# Patient Record
Sex: Female | Born: 1975
Health system: Southern US, Community
[De-identification: ages and names within clinical notes are randomized; demographics above are authoritative.]

## PROBLEM LIST (undated history)

## (undated) HISTORY — PX: TUBAL LIGATION: SHX77

## (undated) HISTORY — PX: CHOLECYSTECTOMY: SHX55

---

## 1999-10-12 ENCOUNTER — Other Ambulatory Visit: Admission: RE | Admit: 1999-10-12 | Discharge: 1999-10-12 | Payer: Self-pay | Admitting: *Deleted

## 2000-06-30 ENCOUNTER — Encounter: Payer: Self-pay | Admitting: Obstetrics and Gynecology

## 2000-06-30 ENCOUNTER — Ambulatory Visit (HOSPITAL_COMMUNITY): Admission: RE | Admit: 2000-06-30 | Discharge: 2000-06-30 | Payer: Self-pay | Admitting: Obstetrics and Gynecology

## 2000-08-22 ENCOUNTER — Inpatient Hospital Stay (HOSPITAL_COMMUNITY): Admission: AD | Admit: 2000-08-22 | Discharge: 2000-08-22 | Payer: Self-pay | Admitting: Obstetrics & Gynecology

## 2000-08-25 ENCOUNTER — Inpatient Hospital Stay (HOSPITAL_COMMUNITY): Admission: AD | Admit: 2000-08-25 | Discharge: 2000-08-27 | Payer: Self-pay | Admitting: Obstetrics and Gynecology

## 2000-09-20 ENCOUNTER — Encounter: Payer: Self-pay | Admitting: Emergency Medicine

## 2000-09-20 ENCOUNTER — Emergency Department (HOSPITAL_COMMUNITY): Admission: EM | Admit: 2000-09-20 | Discharge: 2000-09-20 | Payer: Self-pay | Admitting: Emergency Medicine

## 2000-09-25 ENCOUNTER — Encounter: Payer: Self-pay | Admitting: Gastroenterology

## 2000-09-25 ENCOUNTER — Observation Stay (HOSPITAL_COMMUNITY): Admission: RE | Admit: 2000-09-25 | Discharge: 2000-09-26 | Payer: Self-pay | Admitting: Surgery

## 2000-09-25 ENCOUNTER — Encounter: Payer: Self-pay | Admitting: Surgery

## 2000-10-05 ENCOUNTER — Other Ambulatory Visit: Admission: RE | Admit: 2000-10-05 | Discharge: 2000-10-05 | Payer: Self-pay | Admitting: Obstetrics and Gynecology

## 2002-04-29 ENCOUNTER — Inpatient Hospital Stay (HOSPITAL_COMMUNITY): Admission: AD | Admit: 2002-04-29 | Discharge: 2002-04-29 | Payer: Self-pay | Admitting: Obstetrics and Gynecology

## 2002-05-03 ENCOUNTER — Inpatient Hospital Stay (HOSPITAL_COMMUNITY): Admission: AD | Admit: 2002-05-03 | Discharge: 2002-05-05 | Payer: Self-pay | Admitting: Obstetrics & Gynecology

## 2002-05-06 ENCOUNTER — Encounter: Admission: RE | Admit: 2002-05-06 | Discharge: 2002-06-05 | Payer: Self-pay | Admitting: Obstetrics and Gynecology

## 2002-05-31 ENCOUNTER — Other Ambulatory Visit: Admission: RE | Admit: 2002-05-31 | Discharge: 2002-05-31 | Payer: Self-pay | Admitting: Obstetrics and Gynecology

## 2002-07-06 ENCOUNTER — Encounter: Admission: RE | Admit: 2002-07-06 | Discharge: 2002-08-05 | Payer: Self-pay | Admitting: Obstetrics and Gynecology

## 2003-06-06 ENCOUNTER — Other Ambulatory Visit: Admission: RE | Admit: 2003-06-06 | Discharge: 2003-06-06 | Payer: Self-pay | Admitting: Obstetrics and Gynecology

## 2003-06-27 ENCOUNTER — Observation Stay (HOSPITAL_COMMUNITY): Admission: RE | Admit: 2003-06-27 | Discharge: 2003-06-28 | Payer: Self-pay | Admitting: Obstetrics and Gynecology

## 2004-05-20 ENCOUNTER — Ambulatory Visit (HOSPITAL_COMMUNITY): Admission: RE | Admit: 2004-05-20 | Discharge: 2004-05-20 | Payer: Self-pay | Admitting: Obstetrics and Gynecology

## 2005-11-28 ENCOUNTER — Other Ambulatory Visit: Admission: RE | Admit: 2005-11-28 | Discharge: 2005-11-28 | Payer: Self-pay | Admitting: Obstetrics and Gynecology

## 2015-10-30 ENCOUNTER — Telehealth: Payer: Self-pay | Admitting: *Deleted

## 2015-10-30 NOTE — Telephone Encounter (Signed)
Unable to reach patient at time of pre-visit call. Left message for patient to return call when available.  

## 2015-11-02 ENCOUNTER — Ambulatory Visit (INDEPENDENT_AMBULATORY_CARE_PROVIDER_SITE_OTHER): Payer: BLUE CROSS/BLUE SHIELD | Admitting: Family Medicine

## 2015-11-02 ENCOUNTER — Encounter: Payer: Self-pay | Admitting: Family Medicine

## 2015-11-02 VITALS — BP 114/88 | HR 90 | Temp 97.9°F | Ht 62.0 in | Wt 205.6 lb

## 2015-11-02 DIAGNOSIS — R51 Headache: Secondary | ICD-10-CM

## 2015-11-02 DIAGNOSIS — R519 Headache, unspecified: Secondary | ICD-10-CM

## 2015-11-02 DIAGNOSIS — N62 Hypertrophy of breast: Secondary | ICD-10-CM | POA: Diagnosis not present

## 2015-11-02 DIAGNOSIS — M546 Pain in thoracic spine: Secondary | ICD-10-CM | POA: Diagnosis not present

## 2015-11-02 MED ORDER — CYCLOBENZAPRINE HCL 10 MG PO TABS: 10.0000 mg | ORAL_TABLET | Freq: Every day | ORAL | Status: DC

## 2015-11-02 MED FILL — CYCLOBENZAPRINE 10 MG TAB: 10 | 30 days supply | Qty: 30 | Fill #0

## 2015-11-02 NOTE — Patient Instructions (Signed)
It was very nice to meet you today!   Please come and see me at your convenience in the next few months for a physical and labs Try the flexeril at bedtime for a week or so- a 1/2 tablet may be enough for you. This medication will make you sleepy so do not use it when you need to drive!  I am hopeful that this will help with your back pain and also your headaches Certainly your heavy breasts are likely contributing to your upper back pain- I will refer you to a plastic surgeon to discuss a possible reduction

## 2015-11-02 NOTE — Progress Notes (Signed)
[place] at [facility] [address] [place], [place] [address] [phone]  Date:  11/02/2015   Name:  Kaitlyn Roach   DOB:  [birth year]   MRN:  [id number]  PCP:  Lamar Blinks, MD    Chief Complaint: Establish Care   History of Present Illness:  Kaitlyn Roach is a 40 y.o. very pleasant female patient who presents with the following:  Here today as a new patient to establish care with me today  She has not had a CPE in several years  She is married, 2 children ages 79 and 60.  She is a bookkeeper.   She did have her gallbladder removed in 2002.  No other major health history.   She has never been a smoker, rarely drinks alcohol She does not exercise much.    She did have some labs a few years ago and a pap -  She reports that her pap was abnormal but the repeat pap was ok.   She does have HA 5-6x a week.  This has been the case for 7-8 months- prior to this she had just occasional HA.   She does have upper back pain as well.   She sits a lot at work.    The HA may last several hours- she will take ibuprofen or excedrin migraine most day of the week.   The HA are often present in the am.  They generally start in the back of her head.  She wonders if they are related to her back pain When she has the HA she does not generally have nausea or phono/ photophobia but this does occur a few times of the month.  No vomiting with HA.  No aura  No aura  She does have DDD breasts and knows that these are likely contributing to her back pain. She has thought about a reduction but has not yet taken any steps in this direction  There are no active problems to display for this patient.   No past medical history on file.  Past Surgical History  Procedure Laterality Date  . Cholecystectomy    . Tubal ligation      Social History  Substance Use Topics  . Smoking status: Never Smoker   . Smokeless tobacco: Never Used   . Alcohol Use: 0.0 oz/week    0 Standard drinks or equivalent per week    Family History  Problem Relation Age of Onset  . Hyperlipidemia Mother   . Hypertension Mother   . Diabetes Father   . Hyperlipidemia Father   . Hypertension Father     Allergies  Allergen Reactions  . Codeine     Medication list has been reviewed and updated.  No current outpatient prescriptions on file prior to visit.   No current facility-administered medications on file prior to visit.    Review of Systems:  As per HPI- otherwise negative.   Physical Examination: Filed Vitals:   11/02/15 1321  BP: 114/88  Pulse: 109  Temp: 97.9 F (36.6 C)   Filed Vitals:   11/02/15 1321  Height: 5\' 2"  (1.575 m)  Weight: 205 lb 9.6 oz (93.26 kg)   Body mass index is 37.6 kg/(m^2). Ideal Body Weight: Weight in (lb) to have BMI = 25: 136.4  GEN: WDWN, NAD, Non-toxic, A & O x 3, overweight with large breasts HEENT: Atraumatic, Normocephalic. Neck supple. No masses, No LAD.  Bilateral TM wnl,  oropharynx normal.  PEERL,EOMI.   Ears and Nose: No external deformity. CV: RRR, No M/G/R. No JVD. No thrill. No extra heart sounds. PULM: CTA B, no wheezes, crackles, rhonchi. No retractions. No resp. distress. No accessory muscle use. EXTR: No c/c/e NEURO Normal gait.  PSYCH: Normally interactive. Conversant. Not depressed or anxious appearing.  Calm demeanor.  She has tenderness of the muscles of the bilateral upper/ thoracic back. 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 She is interested in looking into a reduction and I will refer her to plastics Trial of flexeril for her upper back pain and HA- cautioned that this can make her sleepy and is not for long term use She will update me regarding her progress See patient instructions for more details.     Signed Lamar Blinks, MD

## 2015-11-02 NOTE — Progress Notes (Signed)
Pre visit review using our clinic review tool, if applicable. No additional management support is needed unless otherwise documented below in the visit note. 

## 2015-12-02 ENCOUNTER — Encounter: Payer: Self-pay | Admitting: Family Medicine

## 2015-12-16 ENCOUNTER — Telehealth: Payer: Self-pay | Admitting: Behavioral Health

## 2015-12-16 NOTE — Telephone Encounter (Signed)
Unable to reach patient at time of Pre-Visit Call.  Left message for patient to return call when available.    

## 2015-12-17 ENCOUNTER — Encounter: Payer: Self-pay | Admitting: Family Medicine

## 2015-12-17 ENCOUNTER — Encounter: Payer: BLUE CROSS/BLUE SHIELD | Admitting: Family Medicine

## 2015-12-21 ENCOUNTER — Encounter: Payer: BLUE CROSS/BLUE SHIELD | Admitting: Family Medicine

## 2016-01-13 ENCOUNTER — Telehealth: Payer: Self-pay | Admitting: *Deleted

## 2016-01-13 NOTE — Telephone Encounter (Signed)
Unable to reach patient at time of pre-visit call. Left message for patient to return call when available.  

## 2016-01-14 ENCOUNTER — Encounter: Payer: BLUE CROSS/BLUE SHIELD | Admitting: Family Medicine

## 2016-01-15 ENCOUNTER — Telehealth: Payer: Self-pay | Admitting: Family Medicine

## 2016-01-15 NOTE — Telephone Encounter (Signed)
Pt was no show 01/14/16 for mychart physical. Pt has rescheduled for 01/18/16. Charge or no charge?

## 2016-01-15 NOTE — Telephone Encounter (Signed)
Charge please 

## 2016-01-18 ENCOUNTER — Encounter: Payer: Self-pay | Admitting: Family Medicine

## 2016-01-18 ENCOUNTER — Ambulatory Visit (INDEPENDENT_AMBULATORY_CARE_PROVIDER_SITE_OTHER): Payer: BLUE CROSS/BLUE SHIELD | Admitting: Family Medicine

## 2016-01-18 ENCOUNTER — Other Ambulatory Visit (HOSPITAL_COMMUNITY)
Admission: RE | Admit: 2016-01-18 | Discharge: 2016-01-18 | Disposition: A | Discharge status: Home / Self Care | Payer: BLUE CROSS/BLUE SHIELD | Source: Ambulatory Visit | Attending: Family Medicine | Admitting: Family Medicine

## 2016-01-18 VITALS — BP 119/73 | HR 100 | Temp 98.2°F | Ht 61.0 in | Wt 208.4 lb

## 2016-01-18 DIAGNOSIS — Z131 Encounter for screening for diabetes mellitus: Secondary | ICD-10-CM | POA: Diagnosis not present

## 2016-01-18 DIAGNOSIS — Z130 Encounter for screening for diseases of the blood and blood-forming organs and certain disorders involving the immune mechanism: Secondary | ICD-10-CM | POA: Diagnosis not present

## 2016-01-18 DIAGNOSIS — E669 Obesity, unspecified: Secondary | ICD-10-CM

## 2016-01-18 DIAGNOSIS — R51 Headache: Secondary | ICD-10-CM

## 2016-01-18 DIAGNOSIS — Z124 Encounter for screening for malignant neoplasm of cervix: Secondary | ICD-10-CM

## 2016-01-18 DIAGNOSIS — R519 Headache, unspecified: Secondary | ICD-10-CM

## 2016-01-18 DIAGNOSIS — Z13220 Encounter for screening for lipoid disorders: Secondary | ICD-10-CM

## 2016-01-18 DIAGNOSIS — Z01419 Encounter for gynecological examination (general) (routine) without abnormal findings: Secondary | ICD-10-CM | POA: Diagnosis present

## 2016-01-18 DIAGNOSIS — Z0000 Encounter for general adult medical examination without abnormal findings: Secondary | ICD-10-CM | POA: Diagnosis not present

## 2016-01-18 DIAGNOSIS — Z23 Encounter for immunization: Secondary | ICD-10-CM

## 2016-01-18 DIAGNOSIS — Z1151 Encounter for screening for human papillomavirus (HPV): Secondary | ICD-10-CM | POA: Diagnosis not present

## 2016-01-18 DIAGNOSIS — Z1329 Encounter for screening for other suspected endocrine disorder: Secondary | ICD-10-CM | POA: Diagnosis not present

## 2016-01-18 DIAGNOSIS — M549 Dorsalgia, unspecified: Secondary | ICD-10-CM

## 2016-01-18 DIAGNOSIS — N62 Hypertrophy of breast: Secondary | ICD-10-CM

## 2016-01-18 DIAGNOSIS — M546 Pain in thoracic spine: Secondary | ICD-10-CM

## 2016-01-18 LAB — HEMOGLOBIN A1C: Hgb A1c MFr Bld: 5.2 % (ref 4.6–6.5)

## 2016-01-18 LAB — COMPREHENSIVE METABOLIC PANEL
ALT: 26 U/L (ref 0–35)
AST: 19 U/L (ref 0–37)
Albumin: 4 g/dL (ref 3.5–5.2)
Alkaline Phosphatase: 104 U/L (ref 39–117)
BUN: 8 mg/dL (ref 6–23)
CO2: 25 mEq/L (ref 19–32)
Calcium: 9.2 mg/dL (ref 8.4–10.5)
Chloride: 103 mEq/L (ref 96–112)
Creatinine, Ser: 0.62 mg/dL (ref 0.40–1.20)
GFR: 113.3 mL/min (ref >60.00)
Glucose, Bld: 91 mg/dL (ref 70–99)
Potassium: 3.7 mEq/L (ref 3.5–5.1)
Sodium: 137 mEq/L (ref 135–145)
Total Bilirubin: 0.3 mg/dL (ref 0.2–1.2)
Total Protein: 6.9 g/dL (ref 6.0–8.3)

## 2016-01-18 LAB — LIPID PANEL
Cholesterol: 237 mg/dL — ABNORMAL HIGH (ref 0–200)
HDL: 42.3 mg/dL (ref >39.00)
LDL Cholesterol: 158 mg/dL — ABNORMAL HIGH (ref 0–99)
NonHDL: 195.18
Total CHOL/HDL Ratio: 6
Triglycerides: 188 mg/dL — ABNORMAL HIGH (ref 0.0–149.0)
VLDL: 37.6 mg/dL (ref 0.0–40.0)

## 2016-01-18 LAB — CBC
HCT: 39.2 % (ref 36.0–46.0)
Hemoglobin: 13 g/dL (ref 12.0–15.0)
MCHC: 33.1 g/dL (ref 30.0–36.0)
MCV: 81.9 fl (ref 78.0–100.0)
Platelets: 226 10*3/uL (ref 150.0–400.0)
RBC: 4.79 Mil/uL (ref 3.87–5.11)
RDW: 14.4 % (ref 11.5–15.5)
WBC: 9.6 10*3/uL (ref 4.0–10.5)

## 2016-01-18 LAB — TSH: TSH: 0.72 u[IU]/mL (ref 0.35–4.50)

## 2016-01-18 MED ORDER — CYCLOBENZAPRINE HCL 10 MG PO TABS: 10.0000 mg | ORAL_TABLET | Freq: Every day | ORAL | Status: DC

## 2016-01-18 MED FILL — CYCLOBENZAPRINE 10 MG TAB: 10 | 30 days supply | Qty: 30 | Fill #0

## 2016-01-18 NOTE — Progress Notes (Signed)
Pre visit review using our clinic review tool, if applicable. No additional management support is needed unless otherwise documented below in the visit note. 

## 2016-01-18 NOTE — Telephone Encounter (Signed)
Marked to charge and mailing no show letter °

## 2016-01-18 NOTE — Patient Instructions (Addendum)
I will be in touch with your labs asap-  You got your tetanus shot today Please contact Dr. Leafy Ro office with your breast reduction denial letter; they are used to dealing with this and can likely try to get this approved for you  It it time for a mammogram this year (if not done already). This is offered at the imaging department downstairs - [phone] I will go ahead and set you up for physical therapy for your back pain and headaches

## 2016-01-18 NOTE — Progress Notes (Signed)
[place] at [facility] [address] [place], [place] [address] [phone]  Date:  01/18/2016   Name:  Kaitlyn Roach   DOB:  1976-08-02   MRN:  [id number]  PCP:  Lamar Blinks, MD    Chief Complaint: Annual Exam   History of Present Illness:  Kaitlyn Roach is a 40 y.o. very pleasant female patient who presents with the following:  Here today seeking a CPE, labs and pap.  She is fasting today for labs. Seen by myself in March with headaches-  Partial HPI from that visit  She is married, 2 children ages 80 and 37. She is a bookkeeper.  She did have her gallbladder removed in 2002. No other major health history.  She has never been a smoker, rarely drinks alcohol She does not exercise much.  She did have some labs a few years ago and a pap - She reports that her pap was abnormal but the repeat pap was ok.  She does have HA 5-6x a week. This has been the case for 7-8 months- prior to this she had just occasional HA.  She does have upper back pain as well.  She sits a lot at work.   At that visit we tried some prn flexeril which has helped with her HA- she needs a refill of this.  She continues to notice pain in her upper back and neck, more so in the morning after sleeping She did see plastic surgery- she wants to to the breast reduction but was told this would not be covered by her insurance She has been trying to lose weight and disappointed to not see progress today  abnl pap about 3.5 years ago but normal on repeat  LMP 5/26   Wt Readings from Last 3 Encounters:  01/18/16 208 lb 6.4 oz (94.53 kg)  11/02/15 205 lb 9.6 oz (93.26 kg)     Patient Active Problem List   Diagnosis Date Noted  . Bilateral thoracic back pain 11/02/2015  . Macromastia 11/02/2015    No past medical history on file.  Past Surgical History  Procedure Laterality Date  . Cholecystectomy    . Tubal ligation       Social History  Substance Use Topics  . Smoking status: Never Smoker   . Smokeless tobacco: Never Used  . Alcohol Use: 0.0 oz/week    0 Standard drinks or equivalent per week    Family History  Problem Relation Age of Onset  . Hyperlipidemia Mother   . Hypertension Mother   . Diabetes Father   . Hyperlipidemia Father   . Hypertension Father     Allergies  Allergen Reactions  . Codeine     Medication list has been reviewed and updated.  Current Outpatient Prescriptions on File Prior to Visit  Medication Sig Dispense Refill  . cyclobenzaprine (FLEXERIL) 10 MG tablet Take 1 tablet (10 mg total) by mouth at bedtime. 30 tablet 0   No current facility-administered medications on file prior to visit.    Review of Systems:  As per HPI- otherwise negative.   Physical Examination: Filed Vitals:   01/18/16 0832  BP: 119/73  Pulse: 100  Temp: 98.2 F (36.8 C)   Filed Vitals:   01/18/16 0832  Height: 5\' 1"  (1.549 m)  Weight: 208 lb 6.4 oz (94.53 kg)   Body mass index is 39.4 kg/(m^2). Ideal Body Weight: Weight in (lb) to have BMI =  25: 132  GEN: WDWN, NAD, Non-toxic, A & O x 3, obese, looks well HEENT: Atraumatic, Normocephalic. Neck supple. No masses, No LAD.  Bilateral TM wnl, oropharynx normal.  PEERL,EOMI.   Ears and Nose: No external deformity. CV: RRR, No M/G/R. No JVD. No thrill. No extra heart sounds. PULM: CTA B, no wheezes, crackles, rhonchi. No retractions. No resp. distress. No accessory muscle use. ABD: S, NT, ND,. No rebound. No HSM. EXTR: No c/c/e NEURO Normal gait.  PSYCH: Normally interactive. Conversant. Not depressed or anxious appearing.  Calm demeanor.  Breast: normal exam, no masses/ dimpling/ discharge. Macromastia Pelvic: normal, no vaginal lesions or discharge. Uterus normal, no CMT, no adnexal tendereness or masses    Assessment and Plan: Physical exam  Screening for hyperlipidemia - Plan: Lipid panel  Screening for deficiency  anemia - Plan: CBC  Screening for thyroid disorder - Plan: TSH  Screening for diabetes mellitus - Plan: Comprehensive metabolic panel, Hemoglobin A1c  Immunization due - Plan: Tdap vaccine greater than or equal to 7yo IM  Screening for cervical cancer - Plan: Cytology - PAP  Bilateral thoracic back pain - Plan: cyclobenzaprine (FLEXERIL) 10 MG tablet  Frequent headaches - Plan: cyclobenzaprine (FLEXERIL) 10 MG tablet  Macromastia - Plan: Ambulatory referral to Physical Therapy  Upper back pain - Plan: Ambulatory referral to Physical Therapy   CPE and labs today tdap today Referral to PT for her back and neck pain- encouraged her to look further into getting her breast reduction covered; dr. Willow Ora office may be able to help her in this regard Will plan further follow- up pending labs.  I will be in touch with your labs asap-  You got your tetanus shot today Please contact Dr. Leafy Ro office with your breast reduction denial letter; they are used to dealing with this and can likely try to get this approved for you  It it time for a mammogram this year (if not done already). This is offered at the imaging department downstairs - [phone] I will go ahead and set you up for physical therapy for your back pain and headaches    Signed Lamar Blinks, MD

## 2016-01-19 LAB — CYTOLOGY - PAP

## 2016-02-04 ENCOUNTER — Encounter: Payer: Self-pay | Admitting: Family Medicine

## 2016-02-22 ENCOUNTER — Telehealth: Payer: Self-pay | Admitting: Family Medicine

## 2016-02-22 ENCOUNTER — Ambulatory Visit: Payer: BLUE CROSS/BLUE SHIELD | Admitting: Family Medicine

## 2016-02-22 DIAGNOSIS — Z0289 Encounter for other administrative examinations: Secondary | ICD-10-CM

## 2016-02-22 NOTE — Telephone Encounter (Signed)
Patient left message on VM @ 8:10 today stating she no longer needed appointment today. Charge or No Charge?

## 2016-02-22 NOTE — Telephone Encounter (Signed)
No charge but please send her a letter reminding her of policy

## 2016-02-24 ENCOUNTER — Encounter: Payer: Self-pay | Admitting: Family Medicine

## 2016-06-02 ENCOUNTER — Ambulatory Visit (INDEPENDENT_AMBULATORY_CARE_PROVIDER_SITE_OTHER): Payer: BLUE CROSS/BLUE SHIELD | Admitting: Emergency Medicine

## 2016-06-02 DIAGNOSIS — Z23 Encounter for immunization: Secondary | ICD-10-CM

## 2016-06-08 ENCOUNTER — Other Ambulatory Visit: Payer: Self-pay | Admitting: Family Medicine

## 2016-06-08 DIAGNOSIS — Z1231 Encounter for screening mammogram for malignant neoplasm of breast: Secondary | ICD-10-CM

## 2016-06-09 ENCOUNTER — Ambulatory Visit (HOSPITAL_BASED_OUTPATIENT_CLINIC_OR_DEPARTMENT_OTHER)
Admission: RE | Admit: 2016-06-09 | Discharge: 2016-06-09 | Disposition: A | Discharge status: Home / Self Care | Payer: BLUE CROSS/BLUE SHIELD | Source: Ambulatory Visit | Attending: Family Medicine | Admitting: Family Medicine

## 2016-06-09 DIAGNOSIS — Z1231 Encounter for screening mammogram for malignant neoplasm of breast: Secondary | ICD-10-CM | POA: Diagnosis not present

## 2016-06-15 ENCOUNTER — Other Ambulatory Visit: Payer: Self-pay | Admitting: Family Medicine

## 2016-06-15 DIAGNOSIS — R928 Other abnormal and inconclusive findings on diagnostic imaging of breast: Secondary | ICD-10-CM

## 2016-06-21 ENCOUNTER — Ambulatory Visit
Admission: RE | Admit: 2016-06-21 | Discharge: 2016-06-21 | Disposition: A | Discharge status: Home / Self Care | Payer: BLUE CROSS/BLUE SHIELD | Source: Ambulatory Visit | Attending: Family Medicine | Admitting: Family Medicine

## 2016-06-21 DIAGNOSIS — R928 Other abnormal and inconclusive findings on diagnostic imaging of breast: Secondary | ICD-10-CM

## 2017-05-09 ENCOUNTER — Other Ambulatory Visit: Payer: Self-pay | Admitting: Family Medicine

## 2017-05-09 DIAGNOSIS — Z1231 Encounter for screening mammogram for malignant neoplasm of breast: Secondary | ICD-10-CM

## 2017-05-14 NOTE — Progress Notes (Addendum)
[place] at [facility] [address], [place], [place] [address] [phone]  Date:  05/17/2017   Name:  Kaitlyn Roach   DOB:  [birth year]   MRN:  [id number]  PCP:  Darreld Mclean, MD    Chief Complaint: Annual Exam   History of Present Illness:  Kaitlyn Roach is a 41 y.o. very pleasant female patient who presents with the following:  Here today for a CPE Last visit with me in June of last year She is married, 2 children ages 33 and 68. She is a bookkeeper.  She did have her gallbladder removed in 2002. No other major health history.  She has never been a smoker, rarely drinks alcohol She does not exercise much.  S/p BTL, cholecystectomy Flu shot due: done today Mammo: done 11/17 Pap: 6/17, negative for HPV Due for labs today if she wants to have done.  She is not fasting today  She notes that her menses have been quite irregular recently she has been bleeding off an on for about 6 weeks. She will have some cramping and pain, might feel like menstrual cramps She has had a tubal and an ablation- this was done due to menorrhagia, she thinks this was in 2003 or 2004 Her GYN doctor was Dr. Quincy Simmonds- however she has not seen here in some time  She has not had irregular menses like this in the past to this extent   The children are doing ok in school this year- 60 and 57 years old.  Her daughter is home schooled and her son is at the local HS   She notes that she has felt more anxious and depressed for the last 4-5 months- her husband has noticed this and wanted her to mention it She notes that she feels really tired- "I can't get enough sleep."  She is sometimes having crying spells She notes both depression and anxiety She has not really dealt with this in the past, never treated for same No SI She would like to try some medication for depresion.  Her dad is using an SSRI but she is not sure which one  She has  noted a spot on her right eyelid for 6 months or so which seems to be getting larger    Patient Active Problem List   Diagnosis Date Noted  . Obesity, unspecified 01/18/2016  . Bilateral thoracic back pain 11/02/2015  . Macromastia 11/02/2015    No past medical history on file.  Past Surgical History:  Procedure Laterality Date  . CHOLECYSTECTOMY    . TUBAL LIGATION      Social History  Substance Use Topics  . Smoking status: Never Smoker  . Smokeless tobacco: Never Used  . Alcohol use 0.0 oz/week    Family History  Problem Relation Age of Onset  . Hyperlipidemia Mother   . Hypertension Mother   . Diabetes Father   . Hyperlipidemia Father   . Hypertension Father     Allergies  Allergen Reactions  . Codeine     Medication list has been reviewed and updated.  Current Outpatient Prescriptions on File Prior to Visit  Medication Sig Dispense Refill  . cyclobenzaprine (FLEXERIL) 10 MG tablet Take 1 tablet (10 mg total) by mouth at bedtime. 30 tablet 1   No current facility-administered medications on file prior to visit.     Review of Systems:  As per HPI- otherwise negative.  Physical Examination: Vitals:   05/17/17 1504  BP: 123/84  Pulse: 87  Temp: 97.9 F (36.6 C)  SpO2: 99%   Vitals:   05/17/17 1504  Weight: 201 lb (91.2 kg)  Height: 5\' 1"  (1.549 m)   Body mass index is 37.98 kg/m. Ideal Body Weight: Weight in (lb) to have BMI = 25: 132  GEN: WDWN, NAD, Non-toxic, A & O x 3, obese, otherwise looks well HEENT: Atraumatic, Normocephalic. Neck supple. No masses, No LAD.  Bilateral TM wnl, oropharynx normal.  PEERL,EOMI.   Ears and Nose: No external deformity. CV: RRR, No M/G/R. No JVD. No thrill. No extra heart sounds. PULM: CTA B, no wheezes, crackles, rhonchi. No retractions. No resp. distress. No accessory muscle use. ABD: S, NT, ND, +BS. No rebound. No HSM. EXTR: No c/c/e NEURO Normal gait.  PSYCH: Normally interactive. Conversant. Not  depressed or anxious appearing.  Calm demeanor.  Breast: normal exam, no masses/ dimpling/ discharge Pelvic: normal, no vaginal lesions or discharge. Uterus normal, no CMT, no adnexal tendereness or masses   Results for orders placed or performed in visit on 05/17/17  POCT urine pregnancy  Result Value Ref Range   Preg Test, Ur Negative Negative     Assessment and Plan: Physical exam  Screening for hyperlipidemia - Plan: Lipid panel  Screening for deficiency anemia - Plan: CBC  Screening for thyroid disorder - Plan: TSH  Screening for diabetes mellitus - Plan: Comprehensive metabolic panel, Hemoglobin A1c  Screening for cervical cancer - Plan: Cytology - PAP  Irregular menses - Plan: POCT urine pregnancy, FSH  Anxiety and depression - Plan: TSH, FLUoxetine (PROZAC) 20 MG tablet  Here today for a CPE- labs pending as above She has also noted spotty menstrual bleeding for the last 4-6 weeks. S/p BTL- confirmed that she is not pregnant Will obtain pap and labs as above If all normal and bleeding continues will refer to GYN She has noted depression for the last few months- no SI, but she would like to try a medication for this.  Will start her on prozac 20 mg. Also encouraged exercise and time outdoors. She will see me in a few weeks for a recheck- she will seek help right away if not doing ok in the meantime  Signed Lamar Blinks, MD  10/4- message to pt Received her labs except pap is still pending Results for orders placed or performed in visit on 05/17/17  CBC  Result Value Ref Range   WBC 9.4 4.0 - 10.5 K/uL   RBC 4.81 3.87 - 5.11 Mil/uL   Platelets 224.0 150.0 - 400.0 K/uL   Hemoglobin 13.2 12.0 - 15.0 g/dL   HCT 40.3 36.0 - 46.0 %   MCV 83.8 78.0 - 100.0 fl   MCHC 32.8 30.0 - 36.0 g/dL   RDW 14.4 11.5 - 15.5 %  Comprehensive metabolic panel  Result Value Ref Range   Sodium 138 135 - 145 mEq/L   Potassium 4.1 3.5 - 5.1 mEq/L   Chloride 102 96 - 112 mEq/L    CO2 28 19 - 32 mEq/L   Glucose, Bld 77 70 - 99 mg/dL   BUN 7 6 - 23 mg/dL   Creatinine, Ser 0.63 0.40 - 1.20 mg/dL   Total Bilirubin 0.4 0.2 - 1.2 mg/dL   Alkaline Phosphatase 95 39 - 117 U/L   AST 17 0 - 37 U/L   ALT 22 0 - 35 U/L   Total Protein 7.2 6.0 - 8.3  g/dL   Albumin 4.1 3.5 - 5.2 g/dL   Calcium 9.4 8.4 - 10.5 mg/dL   GFR 110.49 >60.00 mL/min  Lipid panel  Result Value Ref Range   Cholesterol 230 (H) 0 - 200 mg/dL   Triglycerides 243.0 (H) 0.0 - 149.0 mg/dL   HDL 41.70 >39.00 mg/dL   VLDL 48.6 (H) 0.0 - 40.0 mg/dL   Total CHOL/HDL Ratio 6    NonHDL 187.95   TSH  Result Value Ref Range   TSH 0.92 0.35 - 4.50 uIU/mL  FSH  Result Value Ref Range   FSH 6.2 mIU/ML  Hemoglobin A1c  Result Value Ref Range   Hgb A1c MFr Bld 5.3 4.6 - 6.5 %  LDL cholesterol, direct  Result Value Ref Range   Direct LDL 148.0 mg/dL  POCT urine pregnancy  Result Value Ref Range   Preg Test, Ur Negative Negative

## 2017-05-17 ENCOUNTER — Other Ambulatory Visit (HOSPITAL_COMMUNITY)
Admission: RE | Admit: 2017-05-17 | Discharge: 2017-05-17 | Disposition: A | Discharge status: Home / Self Care | Payer: 59 | Source: Ambulatory Visit | Attending: Family Medicine | Admitting: Family Medicine

## 2017-05-17 ENCOUNTER — Encounter: Payer: Self-pay | Admitting: Family Medicine

## 2017-05-17 ENCOUNTER — Ambulatory Visit (INDEPENDENT_AMBULATORY_CARE_PROVIDER_SITE_OTHER): Payer: 59 | Admitting: Family Medicine

## 2017-05-17 VITALS — BP 123/84 | HR 87 | Temp 97.9°F | Ht 61.0 in | Wt 201.0 lb

## 2017-05-17 DIAGNOSIS — Z23 Encounter for immunization: Secondary | ICD-10-CM

## 2017-05-17 DIAGNOSIS — F329 Major depressive disorder, single episode, unspecified: Secondary | ICD-10-CM | POA: Insufficient documentation

## 2017-05-17 DIAGNOSIS — Z130 Encounter for screening for diseases of the blood and blood-forming organs and certain disorders involving the immune mechanism: Secondary | ICD-10-CM | POA: Diagnosis not present

## 2017-05-17 DIAGNOSIS — F419 Anxiety disorder, unspecified: Secondary | ICD-10-CM | POA: Diagnosis not present

## 2017-05-17 DIAGNOSIS — N926 Irregular menstruation, unspecified: Secondary | ICD-10-CM | POA: Diagnosis not present

## 2017-05-17 DIAGNOSIS — Z0000 Encounter for general adult medical examination without abnormal findings: Secondary | ICD-10-CM | POA: Diagnosis not present

## 2017-05-17 DIAGNOSIS — Z131 Encounter for screening for diabetes mellitus: Secondary | ICD-10-CM | POA: Insufficient documentation

## 2017-05-17 DIAGNOSIS — Z1329 Encounter for screening for other suspected endocrine disorder: Secondary | ICD-10-CM

## 2017-05-17 DIAGNOSIS — Z124 Encounter for screening for malignant neoplasm of cervix: Secondary | ICD-10-CM | POA: Diagnosis not present

## 2017-05-17 DIAGNOSIS — Z13220 Encounter for screening for lipoid disorders: Secondary | ICD-10-CM | POA: Insufficient documentation

## 2017-05-17 LAB — POCT URINE PREGNANCY: Preg Test, Ur: NEGATIVE

## 2017-05-17 MED ORDER — FLUOXETINE HCL 20 MG PO TABS: 20.0000 mg | ORAL_TABLET | Freq: Every day | ORAL | 5 refills | Status: DC

## 2017-05-17 MED FILL — FLUoxetine HCL 20 MG CAPS: 20 | 30 days supply | Qty: 60 | Fill #0

## 2017-05-17 NOTE — Patient Instructions (Addendum)
It was nice to see you again today! I will be in touch with your pap and other labs We are going to try prozac, 20 mg for depression and anxiety. Please start on 1 pill a day, and you can increase to 2 pills after 1-2 weeks if well tolerated.    Other things that can help with depression include spending time doing things you enjoy, exercise, and being outdoors.  Try to take good care of yourself!    Please see me in 4-6 weeks to check on how you are feeling  Health Maintenance, Female Adopting a healthy lifestyle and getting preventive care can go a long way to promote health and wellness. Talk with your health care provider about what schedule of regular examinations is right for you. This is a good chance for you to check in with your provider about disease prevention and staying healthy. In between checkups, there are plenty of things you can do on your own. Experts have done a lot of research about which lifestyle changes and preventive measures are most likely to keep you healthy. Ask your health care provider for more information. Weight and diet Eat a healthy diet  Be sure to include plenty of vegetables, fruits, low-fat dairy products, and lean protein.  Do not eat a lot of foods high in solid fats, added sugars, or salt.  Get regular exercise. This is one of the most important things you can do for your health. ? Most adults should exercise for at least 150 minutes each week. The exercise should increase your heart rate and make you sweat (moderate-intensity exercise). ? Most adults should also do strengthening exercises at least twice a week. This is in addition to the moderate-intensity exercise.  Maintain a healthy weight  Body mass index (BMI) is a measurement that can be used to identify possible weight problems. It estimates body fat based on height and weight. Your health care provider can help determine your BMI and help you achieve or maintain a healthy weight.  For  females 30 years of age and older: ? A BMI below 18.5 is considered underweight. ? A BMI of 18.5 to 24.9 is normal. ? A BMI of 25 to 29.9 is considered overweight. ? A BMI of 30 and above is considered obese.  Watch levels of cholesterol and blood lipids  You should start having your blood tested for lipids and cholesterol at 41 years of age, then have this test every 5 years.  You may need to have your cholesterol levels checked more often if: ? Your lipid or cholesterol levels are high. ? You are older than 41 years of age. ? You are at high risk for heart disease.  Cancer screening Lung Cancer  Lung cancer screening is recommended for adults 46-24 years old who are at high risk for lung cancer because of a history of smoking.  A yearly low-dose CT scan of the lungs is recommended for people who: ? Currently smoke. ? Have quit within the past 15 years. ? Have at least a 30-pack-year history of smoking. A pack year is smoking an average of one pack of cigarettes a day for 1 year.  Yearly screening should continue until it has been 15 years since you quit.  Yearly screening should stop if you develop a health problem that would prevent you from having lung cancer treatment.  Breast Cancer  Practice breast self-awareness. This means understanding how your breasts normally appear and feel.  It also  means doing regular breast self-exams. Let your health care provider know about any changes, no matter how small.  If you are in your 20s or 30s, you should have a clinical breast exam (CBE) by a health care provider every 1-3 years as part of a regular health exam.  If you are 72 or older, have a CBE every year. Also consider having a breast X-ray (mammogram) every year.  If you have a family history of breast cancer, talk to your health care provider about genetic screening.  If you are at high risk for breast cancer, talk to your health care provider about having an MRI and a  mammogram every year.  Breast cancer gene (BRCA) assessment is recommended for women who have family members with BRCA-related cancers. BRCA-related cancers include: ? Breast. ? Ovarian. ? Tubal. ? Peritoneal cancers.  Results of the assessment will determine the need for genetic counseling and BRCA1 and BRCA2 testing.  Cervical Cancer Your health care provider may recommend that you be screened regularly for cancer of the pelvic organs (ovaries, uterus, and vagina). This screening involves a pelvic examination, including checking for microscopic changes to the surface of your cervix (Pap test). You may be encouraged to have this screening done every 3 years, beginning at age 73.  For women ages 49-65, health care providers may recommend pelvic exams and Pap testing every 3 years, or they may recommend the Pap and pelvic exam, combined with testing for human papilloma virus (HPV), every 5 years. Some types of HPV increase your risk of cervical cancer. Testing for HPV may also be done on women of any age with unclear Pap test results.  Other health care providers may not recommend any screening for nonpregnant women who are considered low risk for pelvic cancer and who do not have symptoms. Ask your health care provider if a screening pelvic exam is right for you.  If you have had past treatment for cervical cancer or a condition that could lead to cancer, you need Pap tests and screening for cancer for at least 20 years after your treatment. If Pap tests have been discontinued, your risk factors (such as having a new sexual partner) need to be reassessed to determine if screening should resume. Some women have medical problems that increase the chance of getting cervical cancer. In these cases, your health care provider may recommend more frequent screening and Pap tests.  Colorectal Cancer  This type of cancer can be detected and often prevented.  Routine colorectal cancer screening usually  begins at 41 years of age and continues through 41 years of age.  Your health care provider may recommend screening at an earlier age if you have risk factors for colon cancer.  Your health care provider may also recommend using home test kits to check for hidden blood in the stool.  A small camera at the end of a tube can be used to examine your colon directly (sigmoidoscopy or colonoscopy). This is done to check for the earliest forms of colorectal cancer.  Routine screening usually begins at age 57.  Direct examination of the colon should be repeated every 5-10 years through 41 years of age. However, you may need to be screened more often if early forms of precancerous polyps or small growths are found.  Skin Cancer  Check your skin from head to toe regularly.  Tell your health care provider about any new moles or changes in moles, especially if there is a change in  a mole's shape or color.  Also tell your health care provider if you have a mole that is larger than the size of a pencil eraser.  Always use sunscreen. Apply sunscreen liberally and repeatedly throughout the day.  Protect yourself by wearing long sleeves, pants, a wide-brimmed hat, and sunglasses whenever you are outside.  Heart disease, diabetes, and high blood pressure  High blood pressure causes heart disease and increases the risk of stroke. High blood pressure is more likely to develop in: ? People who have blood pressure in the high end of the normal range (130-139/85-89 mm Hg). ? People who are overweight or obese. ? People who are African American.  If you are 35-41 years of age, have your blood pressure checked every 3-5 years. If you are 70 years of age or older, have your blood pressure checked every year. You should have your blood pressure measured twice-once when you are at a hospital or clinic, and once when you are not at a hospital or clinic. Record the average of the two measurements. To check your  blood pressure when you are not at a hospital or clinic, you can use: ? An automated blood pressure machine at a pharmacy. ? A home blood pressure monitor.  If you are between 51 years and 57 years old, ask your health care provider if you should take aspirin to prevent strokes.  Have regular diabetes screenings. This involves taking a blood sample to check your fasting blood sugar level. ? If you are at a normal weight and have a low risk for diabetes, have this test once every three years after 41 years of age. ? If you are overweight and have a high risk for diabetes, consider being tested at a younger age or more often. Preventing infection Hepatitis B  If you have a higher risk for hepatitis B, you should be screened for this virus. You are considered at high risk for hepatitis B if: ? You were born in a country where hepatitis B is common. Ask your health care provider which countries are considered high risk. ? Your parents were born in a high-risk country, and you have not been immunized against hepatitis B (hepatitis B vaccine). ? You have HIV or AIDS. ? You use needles to inject street drugs. ? You live with someone who has hepatitis B. ? You have had sex with someone who has hepatitis B. ? You get hemodialysis treatment. ? You take certain medicines for conditions, including cancer, organ transplantation, and autoimmune conditions.  Hepatitis C  Blood testing is recommended for: ? Everyone born from [birth year] through 1965. ? Anyone with known risk factors for hepatitis C.  Sexually transmitted infections (STIs)  You should be screened for sexually transmitted infections (STIs) including gonorrhea and chlamydia if: ? You are sexually active and are younger than 41 years of age. ? You are older than 41 years of age and your health care provider tells you that you are at risk for this type of infection. ? Your sexual activity has changed since you were last screened and you are at  an increased risk for chlamydia or gonorrhea. Ask your health care provider if you are at risk.  If you do not have HIV, but are at risk, it may be recommended that you take a prescription medicine daily to prevent HIV infection. This is called pre-exposure prophylaxis (PrEP). You are considered at risk if: ? You are sexually active and do not regularly use condoms  or know the HIV status of your partner(s). ? You take drugs by injection. ? You are sexually active with a partner who has HIV.  Talk with your health care provider about whether you are at high risk of being infected with HIV. If you choose to begin PrEP, you should first be tested for HIV. You should then be tested every 3 months for as long as you are taking PrEP. Pregnancy  If you are premenopausal and you may become pregnant, ask your health care provider about preconception counseling.  If you may become pregnant, take 400 to 800 micrograms (mcg) of folic acid every day.  If you want to prevent pregnancy, talk to your health care provider about birth control (contraception). Osteoporosis and menopause  Osteoporosis is a disease in which the bones lose minerals and strength with aging. This can result in serious bone fractures. Your risk for osteoporosis can be identified using a bone density scan.  If you are 52 years of age or older, or if you are at risk for osteoporosis and fractures, ask your health care provider if you should be screened.  Ask your health care provider whether you should take a calcium or vitamin D supplement to lower your risk for osteoporosis.  Menopause may have certain physical symptoms and risks.  Hormone replacement therapy may reduce some of these symptoms and risks. Talk to your health care provider about whether hormone replacement therapy is right for you. Follow these instructions at home:  Schedule regular health, dental, and eye exams.  Stay current with your immunizations.  Do not  use any tobacco products including cigarettes, chewing tobacco, or electronic cigarettes.  If you are pregnant, do not drink alcohol.  If you are breastfeeding, limit how much and how often you drink alcohol.  Limit alcohol intake to no more than 1 drink per day for nonpregnant women. One drink equals 12 ounces of beer, 5 ounces of wine, or 1 ounces of hard liquor.  Do not use street drugs.  Do not share needles.  Ask your health care provider for help if you need support or information about quitting drugs.  Tell your health care provider if you often feel depressed.  Tell your health care provider if you have ever been abused or do not feel safe at home. This information is not intended to replace advice given to you by your health care provider. Make sure you discuss any questions you have with your health care provider. Document Released: 02/14/2011 Document Revised: 01/07/2016 Document Reviewed: 05/05/2015 Elsevier Interactive Patient Education  [employer].

## 2017-05-18 ENCOUNTER — Encounter: Payer: Self-pay | Admitting: Family Medicine

## 2017-05-18 LAB — COMPREHENSIVE METABOLIC PANEL
ALT: 22 U/L (ref 0–35)
AST: 17 U/L (ref 0–37)
Albumin: 4.1 g/dL (ref 3.5–5.2)
Alkaline Phosphatase: 95 U/L (ref 39–117)
BILIRUBIN TOTAL: 0.4 mg/dL (ref 0.2–1.2)
BUN: 7 mg/dL (ref 6–23)
CHLORIDE: 102 meq/L (ref 96–112)
CO2: 28 meq/L (ref 19–32)
Calcium: 9.4 mg/dL (ref 8.4–10.5)
Creatinine, Ser: 0.63 mg/dL (ref 0.40–1.20)
GFR: 110.49 mL/min (ref >60.00)
GLUCOSE: 77 mg/dL (ref 70–99)
POTASSIUM: 4.1 meq/L (ref 3.5–5.1)
Sodium: 138 mEq/L (ref 135–145)
Total Protein: 7.2 g/dL (ref 6.0–8.3)

## 2017-05-18 LAB — CBC
HEMATOCRIT: 40.3 % (ref 36.0–46.0)
HEMOGLOBIN: 13.2 g/dL (ref 12.0–15.0)
MCHC: 32.8 g/dL (ref 30.0–36.0)
MCV: 83.8 fl (ref 78.0–100.0)
PLATELETS: 224 10*3/uL (ref 150.0–400.0)
RBC: 4.81 Mil/uL (ref 3.87–5.11)
RDW: 14.4 % (ref 11.5–15.5)
WBC: 9.4 10*3/uL (ref 4.0–10.5)

## 2017-05-18 LAB — LDL CHOLESTEROL, DIRECT: LDL DIRECT: 148 mg/dL

## 2017-05-18 LAB — LIPID PANEL
CHOL/HDL RATIO: 6
Cholesterol: 230 mg/dL — ABNORMAL HIGH (ref 0–200)
HDL: 41.7 mg/dL (ref >39.00)
NONHDL: 187.95
TRIGLYCERIDES: 243 mg/dL — AB (ref 0.0–149.0)
VLDL: 48.6 mg/dL — AB (ref 0.0–40.0)

## 2017-05-18 LAB — HEMOGLOBIN A1C: Hgb A1c MFr Bld: 5.3 % (ref 4.6–6.5)

## 2017-05-18 LAB — TSH: TSH: 0.92 u[IU]/mL (ref 0.35–4.50)

## 2017-05-18 LAB — FOLLICLE STIMULATING HORMONE: FSH: 6.2 m[IU]/mL

## 2017-05-19 LAB — CYTOLOGY - PAP: DIAGNOSIS: NEGATIVE

## 2017-06-20 DIAGNOSIS — D1801 Hemangioma of skin and subcutaneous tissue: Secondary | ICD-10-CM | POA: Diagnosis not present

## 2017-06-20 DIAGNOSIS — D225 Melanocytic nevi of trunk: Secondary | ICD-10-CM | POA: Diagnosis not present

## 2017-06-20 DIAGNOSIS — L814 Other melanin hyperpigmentation: Secondary | ICD-10-CM | POA: Diagnosis not present

## 2017-09-01 IMAGING — MG 2D DIGITAL DIAGNOSTIC UNILATERAL RIGHT MAMMOGRAM WITH CAD AND AD
5 series · 6 of 13 positions shown · non-contrast
Comparison: Screening mammogram dated 06/09/2016.

CLINICAL DATA: Possible mass in the central, slightly lateral right
breast and possible asymmetry in the medial, posterior right breast
on a recent baseline screening mammogram.

EXAM:
2D DIGITAL DIAGNOSTIC RIGHT MAMMOGRAM WITH CAD AND ADJUNCT TOMO
ULTRASOUND RIGHT BREAST

[R CC synth-2D]
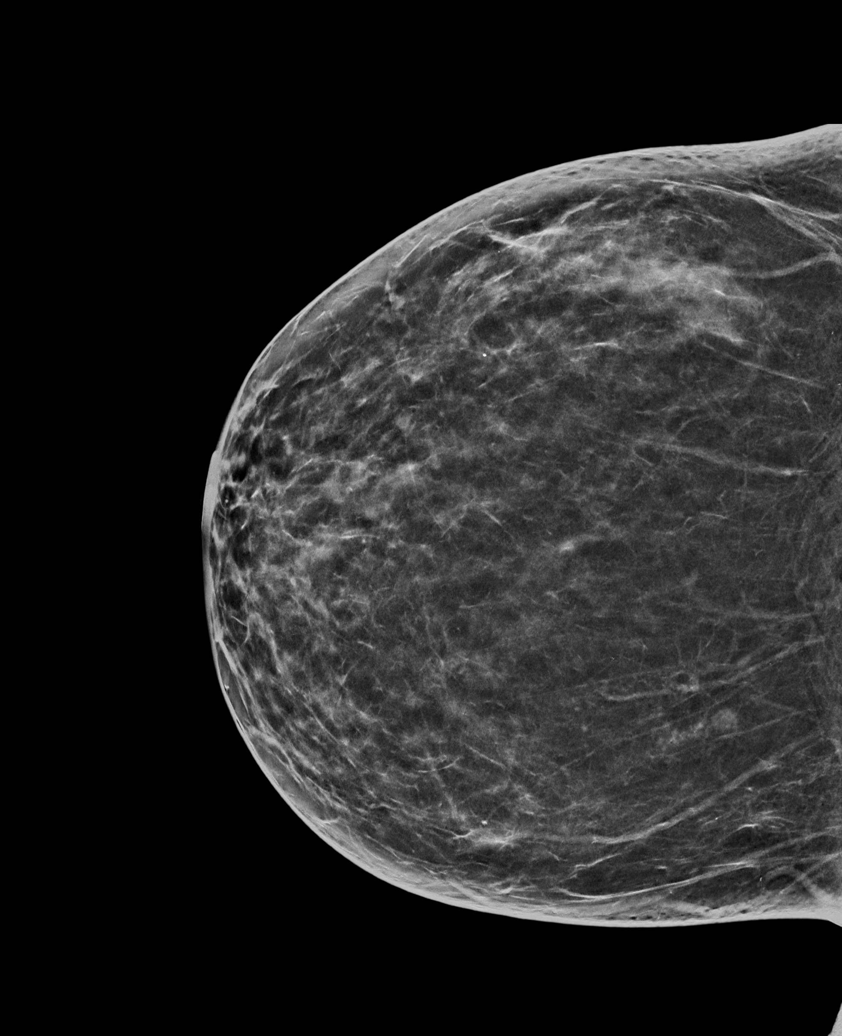

[R MLO synth-2D]
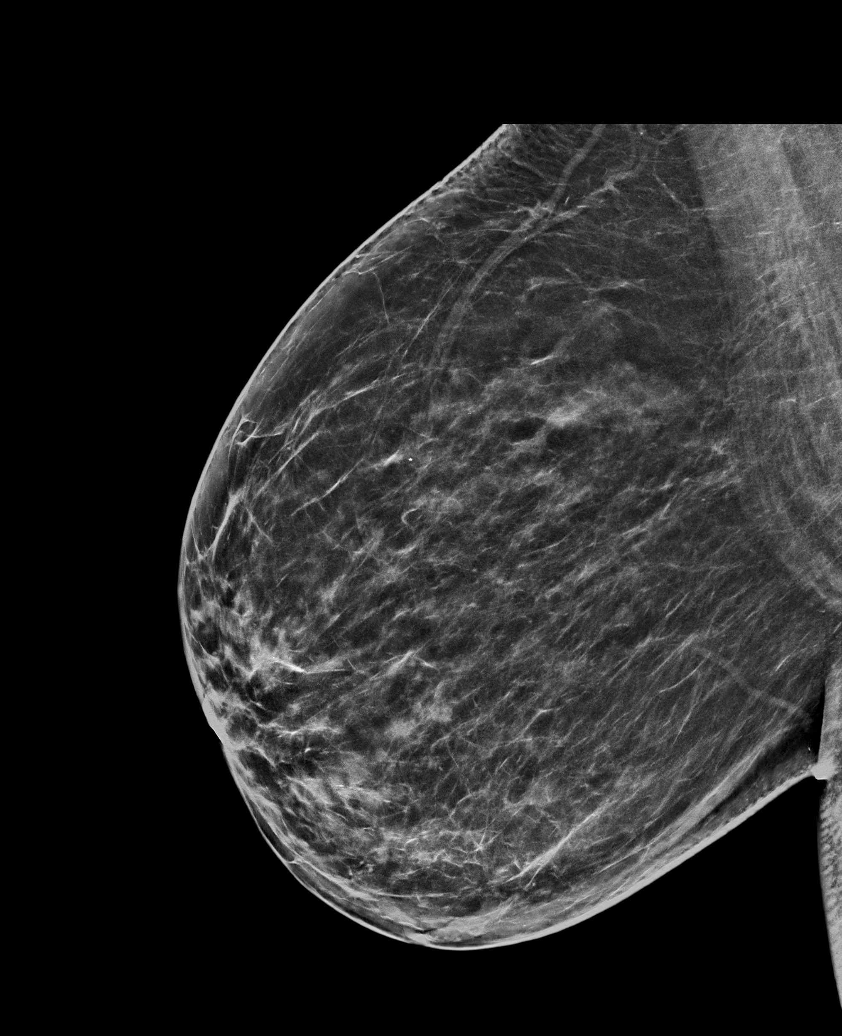

[R MLO]
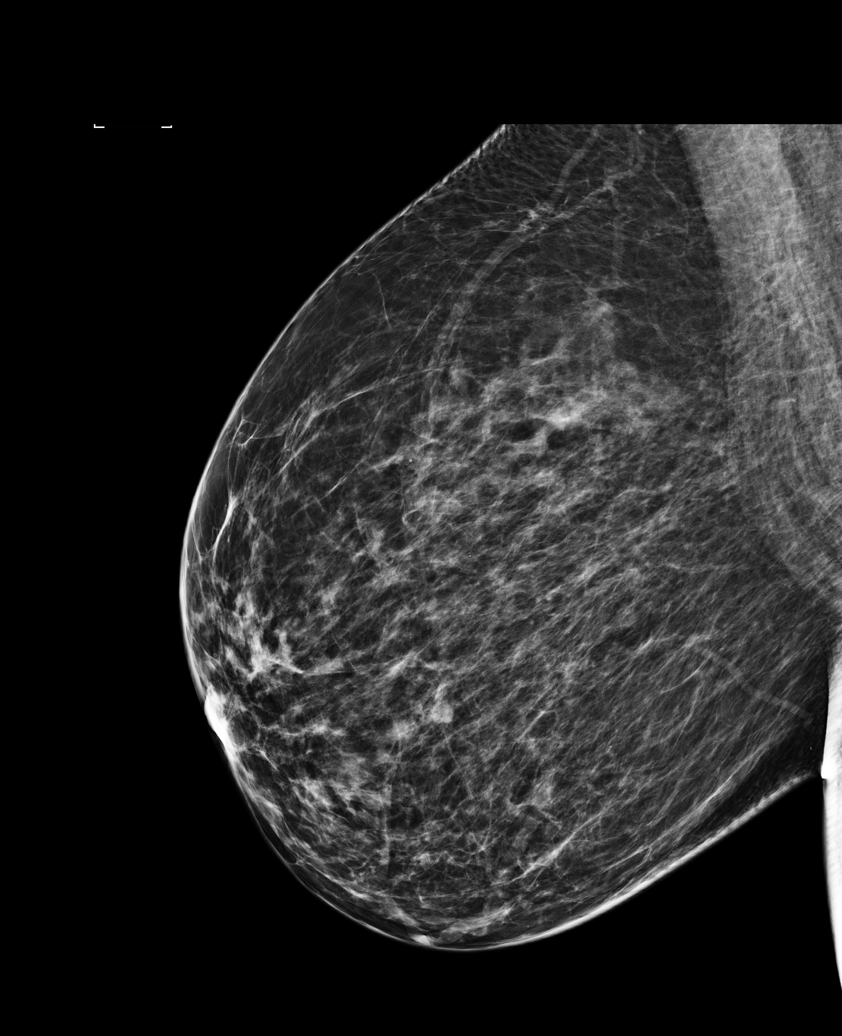

[R MLO tomo · 2 of 80 frames shown]
[frame 26/80]
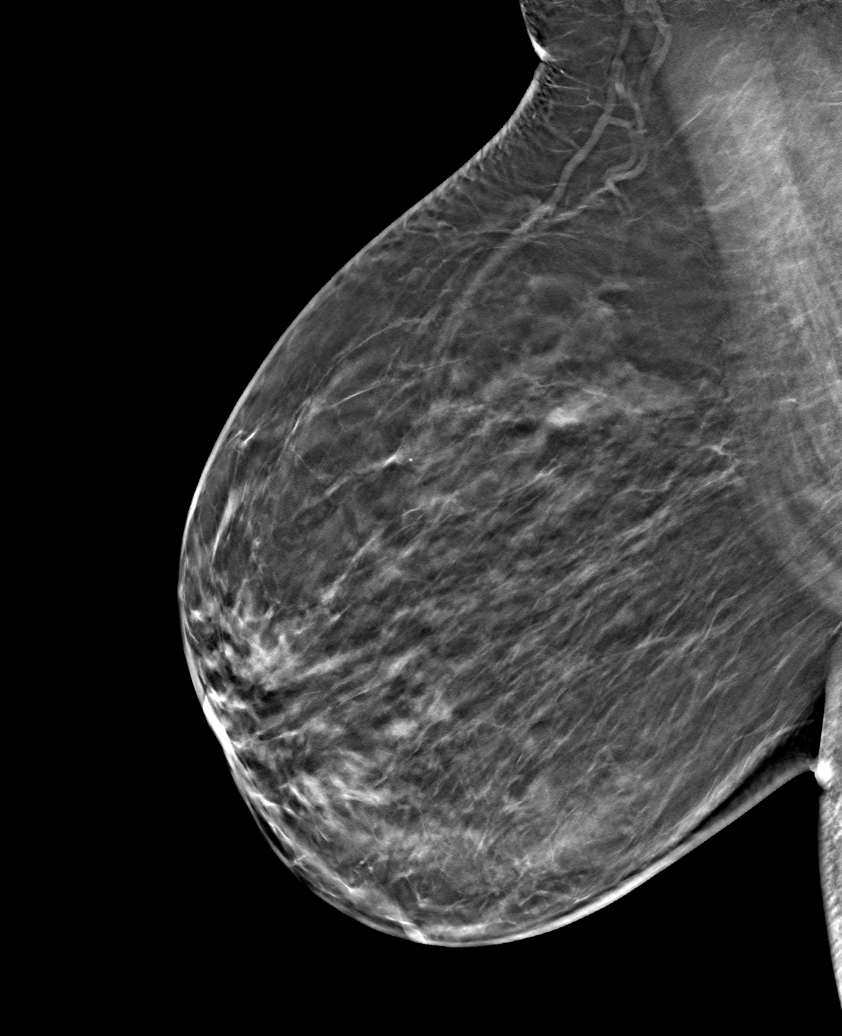
[frame 41/80]
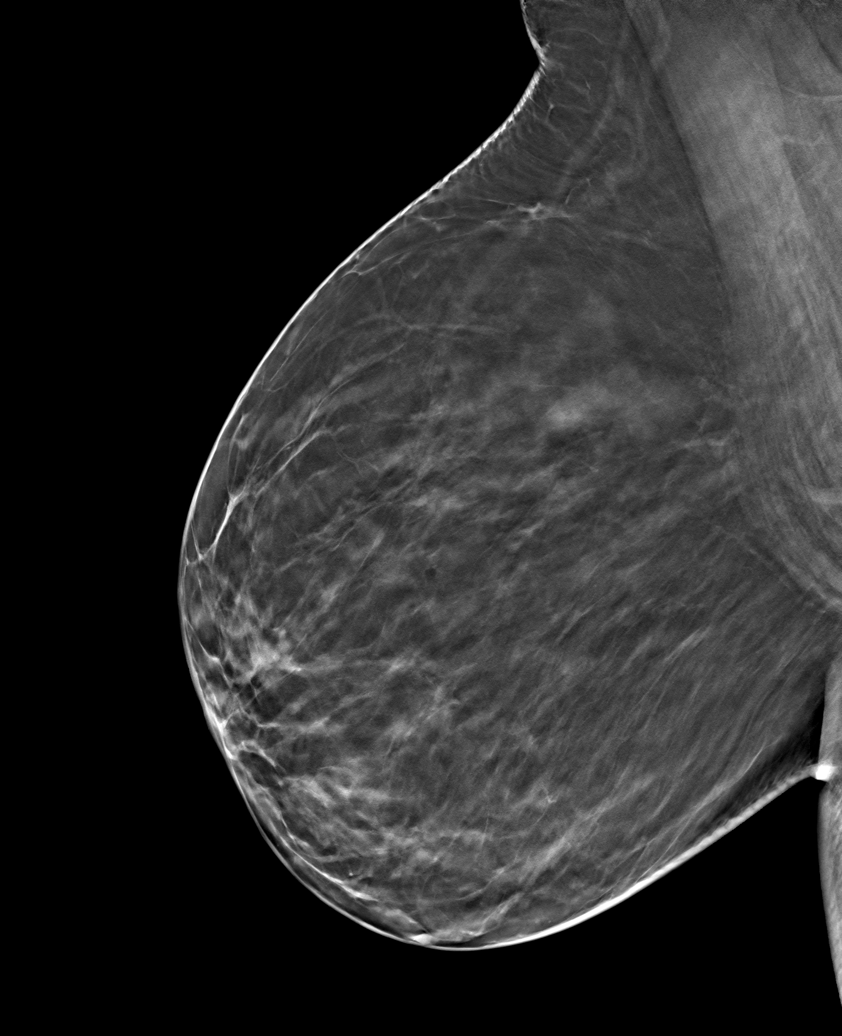

[R CC tomo · tomo slice 37/73.0]
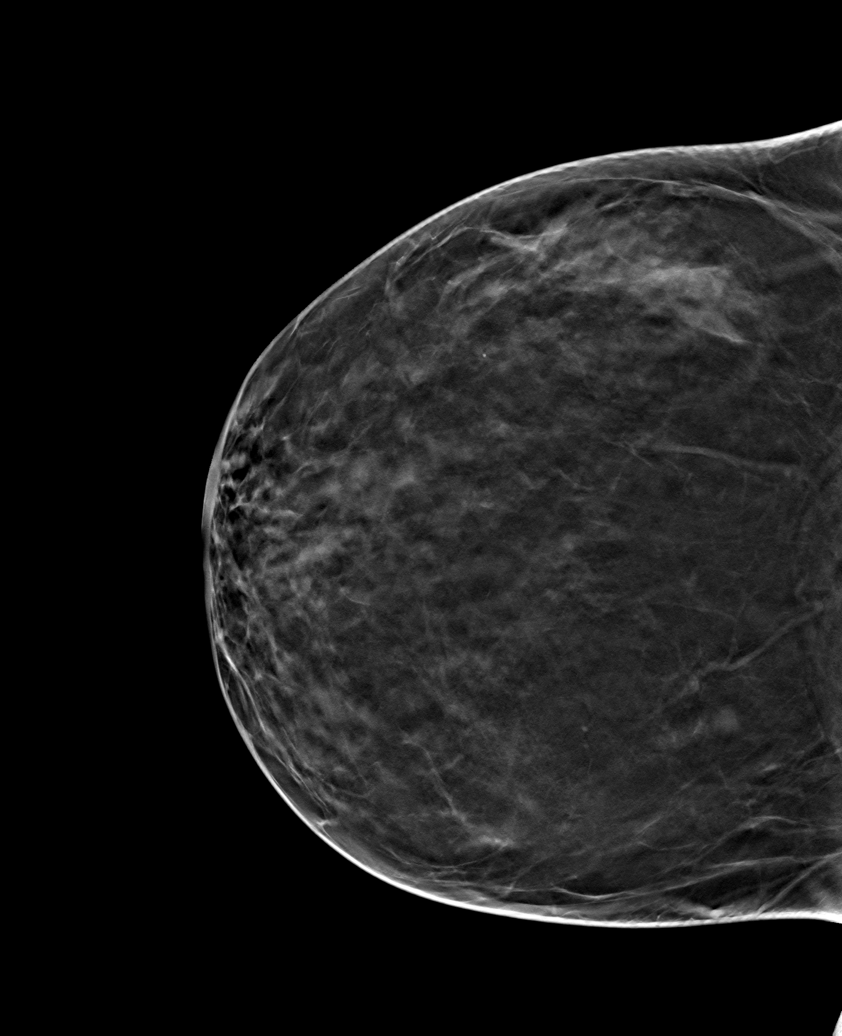

[6 of 13 positions shown; findings below may reference images not displayed]

ACR Breast Density Category b: There are scattered areas of
fibroglandular density.
FINDINGS: 2D 3D tomographic images of the right breast were obtained. These
confirm a small, oval, circumscribed mass in the lower outer
quadrant, anteriorly. There is also a small area of asymmetrical
density in the upper inner quadrant posteriorly. The small
asymmetrical density has interspersed fat.

Mammographic images were processed with CAD.

On physical exam, no mass is palpable in the lower outer right
breast or upper inner right breast.

Targeted ultrasound is performed, showing a 5 mm cyst containing
low-level internal echoes in the 8 o'clock position of the right
breast, 3 cm from the nipple. This corresponds to the mammographic
mass.

There is also an island of fibroglandular tissue in the 1:30 o'clock
position of the right breast, 15 cm from the nipple, corresponding
to the small asymmetrical density with interspersed fat.
IMPRESSION: Small, mildly complicated right breast cyst and island of normal
fibroglandular tissue, as described above. No evidence of
malignancy.

RECOMMENDATION:
Bilateral screening mammogram in 1 year.

I have discussed the findings and recommendations with the patient.
Results were also provided in writing at the conclusion of the
visit. If applicable, a reminder letter will be sent to the patient
regarding the next appointment.

BI-RADS CATEGORY  2: Benign.

## 2017-09-01 IMAGING — US ULTRASOUND RIGHT BREAST LIMITED
1 series · 7 of 7 positions shown · non-contrast
Comparison: Screening mammogram dated 06/09/2016.

CLINICAL DATA: Possible mass in the central, slightly lateral right
breast and possible asymmetry in the medial, posterior right breast
on a recent baseline screening mammogram.

EXAM:
2D DIGITAL DIAGNOSTIC RIGHT MAMMOGRAM WITH CAD AND ADJUNCT TOMO
ULTRASOUND RIGHT BREAST

[Series 1: ultrasound right breast limited · 0.07mm/px · 7 of 7 slices shown]
[im 1/7]
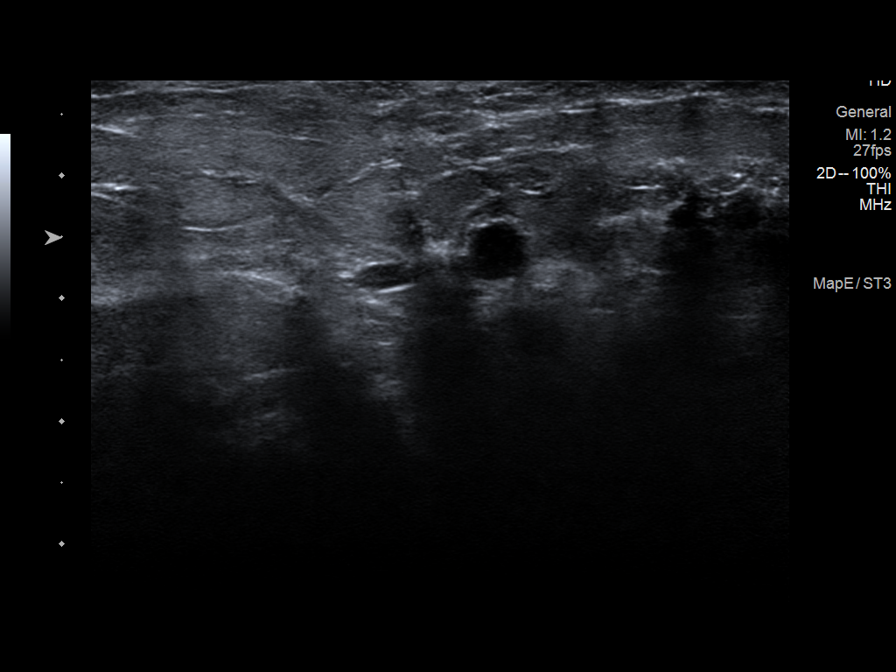
[im 2/7]
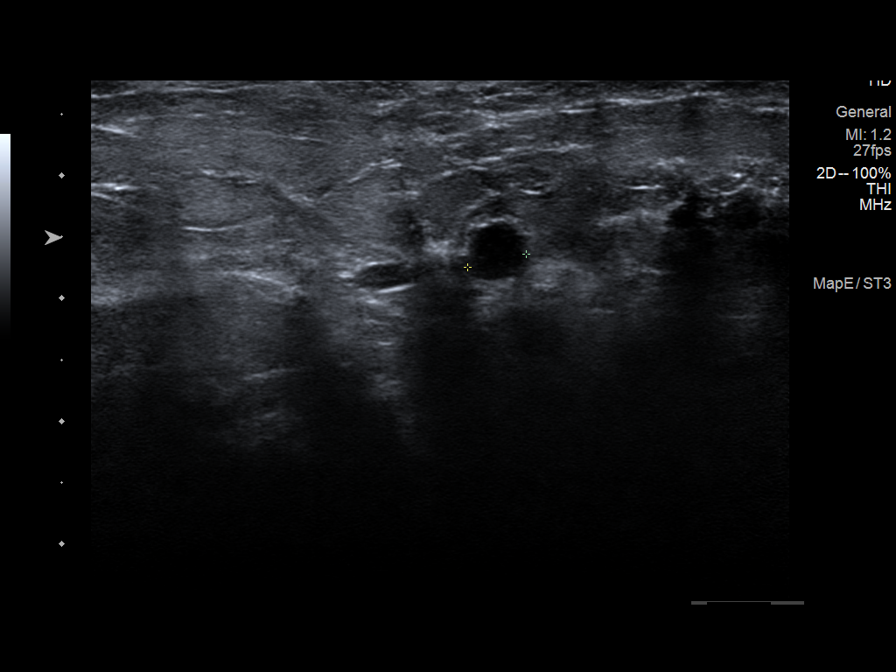
[im 3/7]
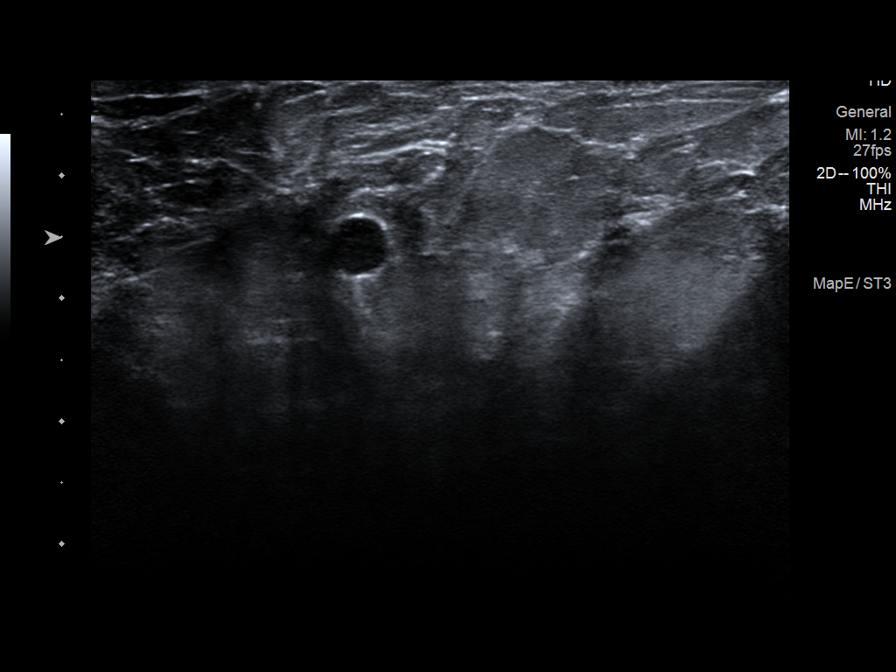
[im 4/7]
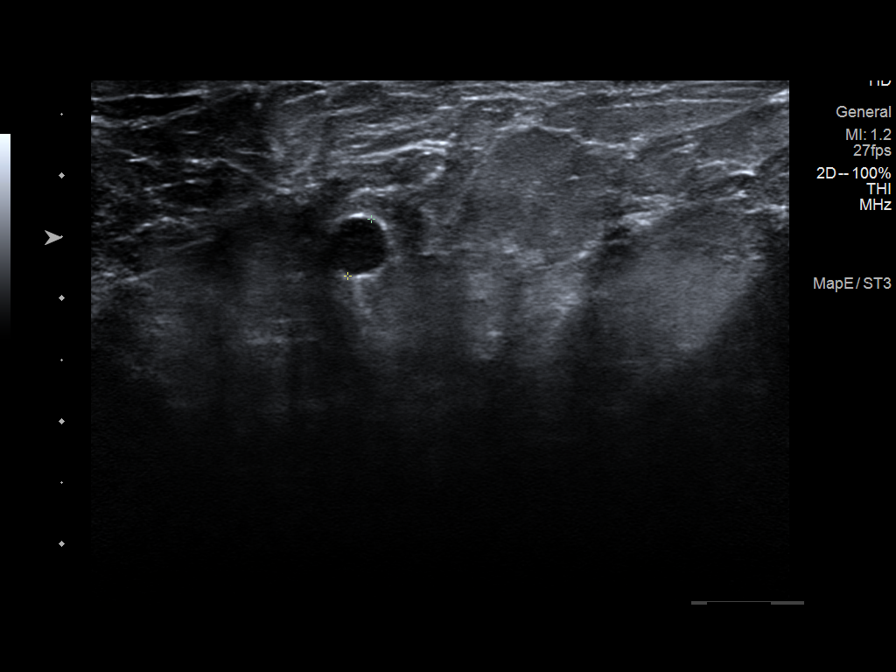
[im 5/7]
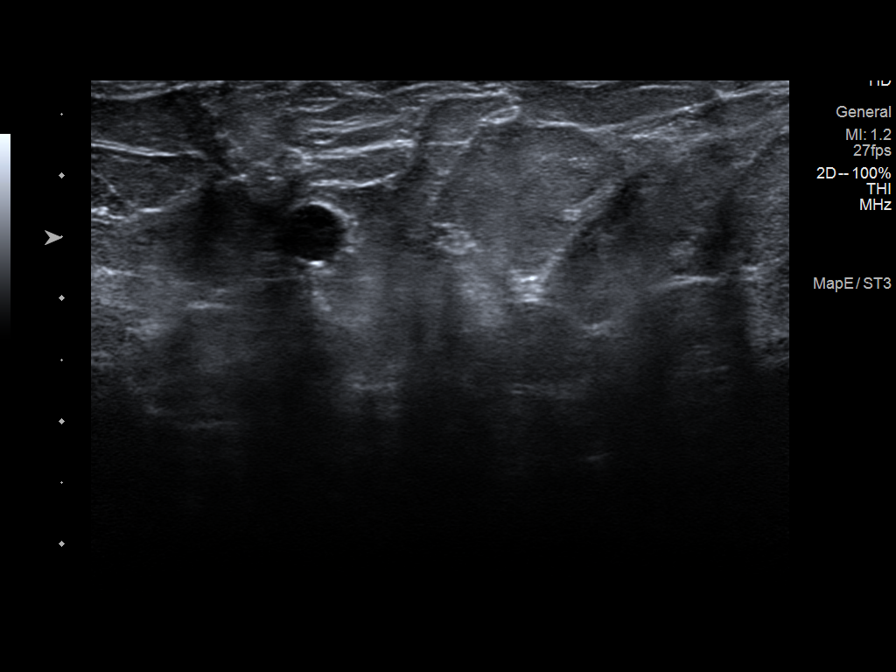
[im 6/7]
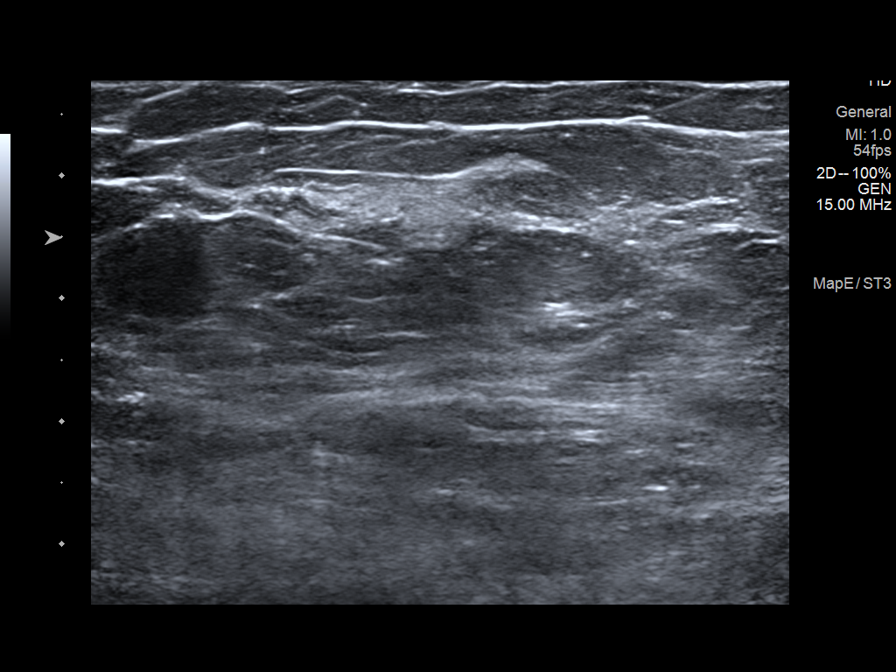
[im 7/7]
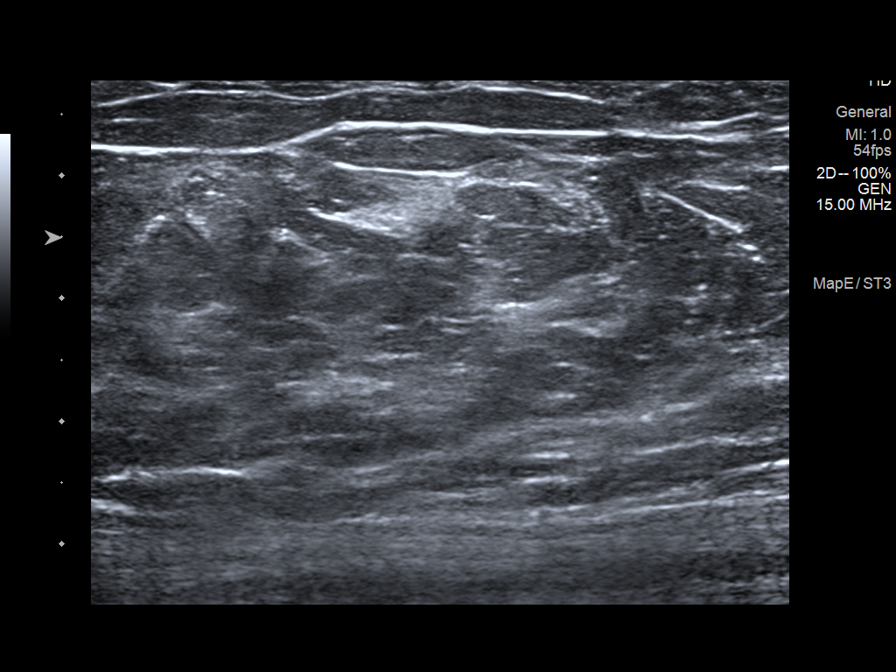

[7 of 7 positions shown; findings below may reference images not displayed]

ACR Breast Density Category b: There are scattered areas of
fibroglandular density.
FINDINGS: 2D 3D tomographic images of the right breast were obtained. These
confirm a small, oval, circumscribed mass in the lower outer
quadrant, anteriorly. There is also a small area of asymmetrical
density in the upper inner quadrant posteriorly. The small
asymmetrical density has interspersed fat.

Mammographic images were processed with CAD.

On physical exam, no mass is palpable in the lower outer right
breast or upper inner right breast.

Targeted ultrasound is performed, showing a 5 mm cyst containing
low-level internal echoes in the 8 o'clock position of the right
breast, 3 cm from the nipple. This corresponds to the mammographic
mass.

There is also an island of fibroglandular tissue in the 1:30 o'clock
position of the right breast, 15 cm from the nipple, corresponding
to the small asymmetrical density with interspersed fat.
IMPRESSION: Small, mildly complicated right breast cyst and island of normal
fibroglandular tissue, as described above. No evidence of
malignancy.

RECOMMENDATION:
Bilateral screening mammogram in 1 year.

I have discussed the findings and recommendations with the patient.
Results were also provided in writing at the conclusion of the
visit. If applicable, a reminder letter will be sent to the patient
regarding the next appointment.

BI-RADS CATEGORY  2: Benign.

## 2017-12-19 NOTE — Progress Notes (Signed)
[facility] at [facility], [place], [place] [address] [phone]  Date:  12/20/2017   Name:  Kaitlyn Roach   DOB:  1976/05/05   MRN:  [id number]  PCP:  Darreld Mclean, MD    Chief Complaint: Cough (c/o dry cough worse at night and first thing in the morning, fatigue and hoarse voice. )   History of Present Illness:  Kaitlyn Roach is a 42 y.o. very pleasant female patient who presents with the following:  Generally healthy except for obesity Here today with concern of illness Sx seemed to start with a cold about 10 days ago- however the cold sx resolved after a couple of days, but cough and fatigue have persisted The cough is generally dry, but she may bring up a little mucus in the am Mucus that she brings up can be yellow/ green No fever noted No GI symptoms She did have a ST, sneezing and runny nose but these are now resolved She has not noted any fever No chills Just feeling tired She is coughing which keeps her up at night  She is allergic to codeine , is not really sure what her reaction was in the past.   No wheezing LMP was 5 days ago   We started prozac for her a few months ago,she is now on 40 mg. She does feel like this is helping her- she is less anxious and feels less depressed.  She would like to continue this med.  Will change to the 40 mg strength   Patient Active Problem List   Diagnosis Date Noted  . Obesity, unspecified 01/18/2016  . Bilateral thoracic back pain 11/02/2015  . Macromastia 11/02/2015    History reviewed. No pertinent past medical history.  Past Surgical History:  Procedure Laterality Date  . CHOLECYSTECTOMY    . TUBAL LIGATION      Social History   Tobacco Use  . Smoking status: Never Smoker  . Smokeless tobacco: Never Used  Substance Use Topics  . Alcohol use: Yes    Alcohol/week: 0.0 oz  . Drug use: Not on file    Family History  Problem  Relation Age of Onset  . Hyperlipidemia Mother   . Hypertension Mother   . Diabetes Father   . Hyperlipidemia Father   . Hypertension Father     Allergies  Allergen Reactions  . Codeine     Medication list has been reviewed and updated.  Current Outpatient Medications on File Prior to Visit  Medication Sig Dispense Refill  . FLUoxetine (PROZAC) 20 MG tablet Take 1 tablet (20 mg total) by mouth daily. Increase to 2 pills after 1-2 weeks 60 tablet 5   No current facility-administered medications on file prior to visit.     Review of Systems:  As per HPI- otherwise negative. No fever or chills No vomiting or diarrhea    Physical Examination: Vitals:   12/20/17 0928  BP: 112/82  Pulse: 83  Temp: 98.1 F (36.7 C)  SpO2: 98%   Vitals:   12/20/17 0928  Weight: 190 lb 6.4 oz (86.4 kg)  Height: 5\' 1"  (1.549 m)   Body mass index is 35.98 kg/m. Ideal Body Weight: Weight in (lb) to have BMI = 25: 132  GEN: WDWN, NAD, Non-toxic, A & O x 3, obese, looks well  HEENT: Atraumatic, Normocephalic. Neck supple. No masses, No LAD.  Bilateral TM wnl, oropharynx normal.  PEERL,EOMI.   Ears and Nose: No external deformity. CV: RRR, No M/G/R. No JVD. No thrill. No extra heart sounds. PULM: CTA B, no wheezes, crackles, rhonchi. No retractions. No resp. distress. No accessory muscle use. EXTR: No c/c/e NEURO Normal gait.  PSYCH: Normally interactive. Conversant. Not depressed or anxious appearing.  Calm demeanor.    Assessment and Plan: Acute bronchitis, unspecified organism - Plan: benzonatate (TESSALON) 100 MG capsule, doxycycline (VIBRAMYCIN) 100 MG capsule  Anxiety and depression - Plan: FLUoxetine (PROZAC) 40 MG capsule  Cough - Plan: benzonatate (TESSALON) 100 MG capsule  Treat for bronchitis and cough with tessalon perles and doxycycline LMP was last week Changed rx to the 40 mg strength of prozac for her  She will let me know if not feeling better in the next few days-  Sooner if worse.      Signed Lamar Blinks, MD

## 2017-12-20 ENCOUNTER — Encounter: Payer: Self-pay | Admitting: Family Medicine

## 2017-12-20 ENCOUNTER — Ambulatory Visit: Payer: 59 | Admitting: Family Medicine

## 2017-12-20 VITALS — BP 112/82 | HR 83 | Temp 98.1°F | Ht 61.0 in | Wt 190.4 lb

## 2017-12-20 DIAGNOSIS — J209 Acute bronchitis, unspecified: Secondary | ICD-10-CM

## 2017-12-20 DIAGNOSIS — F329 Major depressive disorder, single episode, unspecified: Secondary | ICD-10-CM

## 2017-12-20 DIAGNOSIS — F419 Anxiety disorder, unspecified: Secondary | ICD-10-CM

## 2017-12-20 DIAGNOSIS — R05 Cough: Secondary | ICD-10-CM | POA: Diagnosis not present

## 2017-12-20 DIAGNOSIS — R059 Cough, unspecified: Secondary | ICD-10-CM

## 2017-12-20 MED ORDER — BENZONATATE 100 MG PO CAPS: 100.0000 mg | ORAL_CAPSULE | Freq: Three times a day (TID) | ORAL | 0 refills | Status: DC | PRN

## 2017-12-20 MED ORDER — DOXYCYCLINE HYCLATE 100 MG PO CAPS: 100.0000 mg | ORAL_CAPSULE | Freq: Two times a day (BID) | ORAL | 0 refills | Status: DC

## 2017-12-20 MED ORDER — FLUOXETINE HCL 40 MG PO CAPS: 40.0000 mg | ORAL_CAPSULE | Freq: Every day | ORAL | 3 refills | Status: DC

## 2017-12-20 MED FILL — FLUoxetine HCL 40 MG CAPS: 40 | 30 days supply | Qty: 30 | Fill #0

## 2017-12-20 MED FILL — DOXYCYCLINE HYCLATE 100 MG: 100 | 10 days supply | Qty: 20 | Fill #0

## 2017-12-20 MED FILL — BENZONATATE 100 MG CAP: 100 | 13 days supply | Qty: 40 | Fill #0

## 2017-12-20 NOTE — Patient Instructions (Signed)
Good to see you today!  We will continue prozac at 40 mg a day- I changed to the 40 mg capsules for you We will use doxycycline for your bronchitis and tessalon as needed for cough Take care and please let me know if not better

## 2017-12-25 ENCOUNTER — Ambulatory Visit
Admission: RE | Admit: 2017-12-25 | Discharge: 2017-12-25 | Disposition: A | Discharge status: Home / Self Care | Payer: 59 | Source: Ambulatory Visit | Attending: Family Medicine | Admitting: Family Medicine

## 2017-12-25 DIAGNOSIS — Z1231 Encounter for screening mammogram for malignant neoplasm of breast: Secondary | ICD-10-CM | POA: Diagnosis not present

## 2018-01-20 NOTE — Progress Notes (Deleted)
[facility] at [facility] [address], [place], [place] [address] [phone]  Date:  01/22/2018   Name:  Kaitlyn Roach   DOB:  1975-11-11   MRN:  [id number]  PCP:  Darreld Mclean, MD    Chief Complaint: No chief complaint on file.   History of Present Illness:  Kaitlyn Roach is a 42 y.o. very pleasant female patient who presents with the following:  Here today with complaint of wrist pain  History of obestiy  Patient Active Problem List   Diagnosis Date Noted  . Obesity, unspecified 01/18/2016  . Bilateral thoracic back pain 11/02/2015  . Macromastia 11/02/2015    No past medical history on file.  Past Surgical History:  Procedure Laterality Date  . CHOLECYSTECTOMY    . TUBAL LIGATION      Social History   Tobacco Use  . Smoking status: Never Smoker  . Smokeless tobacco: Never Used  Substance Use Topics  . Alcohol use: Yes    Alcohol/week: 0.0 oz  . Drug use: Not on file    Family History  Problem Relation Age of Onset  . Hyperlipidemia Mother   . Hypertension Mother   . Diabetes Father   . Hyperlipidemia Father   . Hypertension Father   . Breast cancer Maternal Grandmother     Allergies  Allergen Reactions  . Codeine     Pt is not sure of reaction, occurred as a child     Medication list has been reviewed and updated.  Current Outpatient Medications on File Prior to Visit  Medication Sig Dispense Refill  . benzonatate (TESSALON) 100 MG capsule Take 1 capsule (100 mg total) by mouth 3 (three) times daily as needed for cough. 40 capsule 0  . doxycycline (VIBRAMYCIN) 100 MG capsule Take 1 capsule (100 mg total) by mouth 2 (two) times daily. 20 capsule 0  . FLUoxetine (PROZAC) 40 MG capsule Take 1 capsule (40 mg total) by mouth daily. 90 capsule 3   No current facility-administered medications on file prior to visit.     Review of Systems:  As per HPI- otherwise  negative.   Physical Examination: There were no vitals filed for this visit. There were no vitals filed for this visit. There is no height or weight on file to calculate BMI. Ideal Body Weight:    GEN: WDWN, NAD, Non-toxic, A & O x 3 HEENT: Atraumatic, Normocephalic. Neck supple. No masses, No LAD. Ears and Nose: No external deformity. CV: RRR, No M/G/R. No JVD. No thrill. No extra heart sounds. PULM: CTA B, no wheezes, crackles, rhonchi. No retractions. No resp. distress. No accessory muscle use. ABD: S, NT, ND, +BS. No rebound. No HSM. EXTR: No c/c/e NEURO Normal gait.  PSYCH: Normally interactive. Conversant. Not depressed or anxious appearing.  Calm demeanor.    Assessment and Plan: ***  Signed Lamar Blinks, MD

## 2018-01-22 ENCOUNTER — Ambulatory Visit: Payer: 59 | Admitting: Family Medicine

## 2018-06-12 NOTE — Progress Notes (Addendum)
[place] at [facility] [address], [place], [place] [address] [phone]  Date:  06/14/2018   Name:  Kaitlyn Roach   DOB:  07/29/1976   MRN:  [id number]  PCP:  Darreld Mclean, MD    Chief Complaint: Annual Exam (flu shot, utd on pap) and Abdominal Pain (knot in upper left abdomen, bloated, discomfort off and on diarrhea and constipation)   History of Present Illness:  Kaitlyn Roach is a 42 y.o. very pleasant female patient who presents with the following:  Here today with concern of about a month of abd bloating, and bowel change with some hard stools intermixed with loose.  Since she had her chole several years ago she generally has loose stools  No vomiting She had felt a knot in her left abd yesterday but this is now resolved  Her bloating was really bad yesterday, better now The constipation is not severe- she may have a little trouble passing stool and has noted blood on the tissue for about 3 weeks.  She has not noted painful stools.  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 She does feel like this is helping her- she is less anxious and feels less depressed.  She would like to continue this med.  Will change to the 40 mg strength   Patient Active Problem List   Diagnosis Date Noted  . Obesity, unspecified 01/18/2016  . Bilateral thoracic back pain 11/02/2015  . Macromastia 11/02/2015     History reviewed. No pertinent past medical history.  Past Surgical History:  Procedure Laterality Date  . CHOLECYSTECTOMY    . TUBAL LIGATION      Social History   Tobacco Use  . Smoking status: Never Smoker  . Smokeless tobacco: Never Used  Substance Use Topics  . Alcohol use: Yes    Alcohol/week: 0.0 standard drinks  . Drug use: Not on file    Family History  Problem Relation Age of Onset  . Hyperlipidemia Mother   . Hypertension Mother   . Diabetes Father   . Hyperlipidemia Father   . Hypertension Father   . Breast cancer Maternal Grandmother     Allergies  Allergen Reactions  . Codeine     Pt is not sure of reaction, occurred as a child     Medication list has been reviewed and updated.  Current Outpatient Medications on File Prior to Visit  Medication Sig Dispense Refill  . FLUoxetine (PROZAC) 40 MG capsule Take 1 capsule (40 mg total) by mouth daily. (Patient not taking: Reported on 06/14/2018) 90 capsule 3   No current facility-administered medications on file prior to visit.     Review of Systems:  As per HPI- otherwise negative. No fever or chills No CP or  SOB No rash    Physical Examination: Vitals:   06/14/18 1452  BP: 128/90  Pulse: 93  Resp: 16  Temp: 98.4 F (36.9 C)  SpO2: 100%   Vitals:   06/14/18 1452  Weight: 202 lb (91.6 kg)  Height: 5\' 1"  (1.549 m)   Body mass index is 38.17 kg/m. Ideal Body Weight: Weight in (lb) to have BMI = 25: 132  GEN: WDWN, NAD, Non-toxic, A & O x 3, overweight, looks well  HEENT: Atraumatic, Normocephalic. Neck supple. No masses, No LAD. Ears and Nose: No external deformity. CV: RRR, No M/G/R. No JVD. No thrill. No extra heart sounds. PULM: CTA B, no wheezes, crackles, rhonchi. No retractions. No resp. distress. No accessory muscle use. ABD: S, NT, ND, +BS. No rebound. No HSM.  No "knot" or tenderness  EXTR: No c/c/e NEURO Normal gait.  PSYCH: Normally interactive. Conversant. Not  depressed or anxious appearing.  Calm demeanor.  No visible hemorrhoids, normal rectal exam today   FOBT negative today   Assessment and Plan: Bowel habit changes - Plan: POCT urine pregnancy, Comprehensive metabolic panel, Ambulatory referral to Gastroenterology, POCT Occult Blood Stool, CANCELED: Comprehensive metabolic panel, CANCELED: IFOBT POC (occult bld, rslt in office)  Need for influenza vaccination - Plan: Flu Vaccine QUAD 6+ mos PF IM (Fluarix Quad PF)  Rectal bleeding - Plan: CBC, Ambulatory referral to Gastroenterology, POCT Occult Blood Stool, CANCELED: CBC, CANCELED: IFOBT POC (occult bld, rslt in office)  Here today with bowel change and rectal bleeding for about one month Flu shot given Basic labs as above FOBT negative today Plan to obtain basic labs for her and refer to GI  Will have her try a stool softener in the meantime   Signed Lamar Blinks, MD  Results for orders placed or performed in visit on 06/14/18  POCT urine pregnancy  Result Value Ref Range   Preg Test, Ur Negative Negative  POCT Occult Blood Stool  Result Value Ref Range   Fecal Occult Blood, POC Negative Negative   Card #1 Date [id number]    Card #2 Fecal Occult Blod, POC Negative    Card #2 Date 06/14/2018    Card #3 Fecal Occult Blood, POC     Card #3 Date      Received her BW 11/1- message to pt  Results for orders placed or performed in visit on 06/14/18  CBC  Result Value Ref Range   WBC 9.7 4.0 - 10.5 K/uL   RBC 4.57 3.87 - 5.11 Mil/uL   Platelets 232.0 150.0 - 400.0 K/uL   Hemoglobin 12.6 12.0 - 15.0 g/dL   HCT 37.6 36.0 - 46.0 %   MCV 82.3 78.0 - 100.0 fl   MCHC 33.5 30.0 - 36.0 g/dL   RDW 14.1 11.5 - 15.5 %  Comprehensive metabolic panel  Result Value Ref Range   Sodium 139 135 - 145 mEq/L   Potassium 3.7 3.5 - 5.1 mEq/L   Chloride 103 96 - 112 mEq/L   CO2 25 19 - 32 mEq/L   Glucose, Bld 83 70 - 99 mg/dL   BUN 8 6 - 23 mg/dL   Creatinine, Ser 0.64 0.40 - 1.20 mg/dL    Total Bilirubin 0.5 0.2 - 1.2 mg/dL   Alkaline Phosphatase 92 39 - 117 U/L   AST 15 0 - 37 U/L   ALT 18 0 - 35 U/L   Total Protein 6.8 6.0 - 8.3 g/dL   Albumin 4.2 3.5 - 5.2 g/dL  Calcium 9.1 8.4 - 10.5 mg/dL   GFR 107.93 >60.00 mL/min  POCT urine pregnancy  Result Value Ref Range   Preg Test, Ur Negative Negative  POCT Occult Blood Stool  Result Value Ref Range   Fecal Occult Blood, POC Negative Negative   Card #1 Date [id number]    Card #2 Fecal Occult Blod, POC Negative    Card #2 Date 06/14/2018    Card #3 Fecal Occult Blood, POC     Card #3 Date

## 2018-06-14 ENCOUNTER — Encounter: Payer: Self-pay | Admitting: Family Medicine

## 2018-06-14 ENCOUNTER — Ambulatory Visit (INDEPENDENT_AMBULATORY_CARE_PROVIDER_SITE_OTHER): Payer: 59 | Admitting: Family Medicine

## 2018-06-14 VITALS — BP 128/90 | HR 93 | Temp 98.4°F | Resp 16 | Ht 61.0 in | Wt 202.0 lb

## 2018-06-14 DIAGNOSIS — Z23 Encounter for immunization: Secondary | ICD-10-CM | POA: Diagnosis not present

## 2018-06-14 DIAGNOSIS — R194 Change in bowel habit: Secondary | ICD-10-CM | POA: Diagnosis not present

## 2018-06-14 DIAGNOSIS — K625 Hemorrhage of anus and rectum: Secondary | ICD-10-CM | POA: Diagnosis not present

## 2018-06-14 LAB — HEMOCCULT GUIAC POC 1CARD (OFFICE)
Card #1 Date: 10312019
FECAL OCCULT BLD: NEGATIVE
Fecal Occult Blood, POC: NEGATIVE

## 2018-06-14 LAB — POCT URINE PREGNANCY: PREG TEST UR: NEGATIVE

## 2018-06-14 NOTE — Patient Instructions (Signed)
It was good to see you today- I will be in touch with your labs asap We will set you up to see gastroenterology asap Please use a stool softener such as colace (docusate sodium) daily to keep your stools soft  Let me know if any changes or concerns

## 2018-06-15 ENCOUNTER — Encounter: Payer: Self-pay | Admitting: Gastroenterology

## 2018-06-15 ENCOUNTER — Encounter: Payer: Self-pay | Admitting: Family Medicine

## 2018-06-15 LAB — CBC
HCT: 37.6 % (ref 36.0–46.0)
Hemoglobin: 12.6 g/dL (ref 12.0–15.0)
MCHC: 33.5 g/dL (ref 30.0–36.0)
MCV: 82.3 fl (ref 78.0–100.0)
Platelets: 232 10*3/uL (ref 150.0–400.0)
RBC: 4.57 Mil/uL (ref 3.87–5.11)
RDW: 14.1 % (ref 11.5–15.5)
WBC: 9.7 10*3/uL (ref 4.0–10.5)

## 2018-06-15 LAB — COMPREHENSIVE METABOLIC PANEL
ALT: 18 U/L (ref 0–35)
AST: 15 U/L (ref 0–37)
Albumin: 4.2 g/dL (ref 3.5–5.2)
Alkaline Phosphatase: 92 U/L (ref 39–117)
BUN: 8 mg/dL (ref 6–23)
CALCIUM: 9.1 mg/dL (ref 8.4–10.5)
CHLORIDE: 103 meq/L (ref 96–112)
CO2: 25 meq/L (ref 19–32)
CREATININE: 0.64 mg/dL (ref 0.40–1.20)
GFR: 107.93 mL/min (ref >60.00)
GLUCOSE: 83 mg/dL (ref 70–99)
Potassium: 3.7 mEq/L (ref 3.5–5.1)
SODIUM: 139 meq/L (ref 135–145)
Total Bilirubin: 0.5 mg/dL (ref 0.2–1.2)
Total Protein: 6.8 g/dL (ref 6.0–8.3)

## 2018-06-20 ENCOUNTER — Encounter: Payer: Self-pay | Admitting: Gastroenterology

## 2018-06-20 ENCOUNTER — Ambulatory Visit: Payer: 59 | Admitting: Gastroenterology

## 2018-06-20 VITALS — BP 132/92 | HR 79 | Ht 61.0 in | Wt 198.4 lb

## 2018-06-20 DIAGNOSIS — D225 Melanocytic nevi of trunk: Secondary | ICD-10-CM | POA: Diagnosis not present

## 2018-06-20 DIAGNOSIS — K625 Hemorrhage of anus and rectum: Secondary | ICD-10-CM

## 2018-06-20 DIAGNOSIS — K582 Mixed irritable bowel syndrome: Secondary | ICD-10-CM

## 2018-06-20 DIAGNOSIS — Z8371 Family history of colonic polyps: Secondary | ICD-10-CM

## 2018-06-20 DIAGNOSIS — L814 Other melanin hyperpigmentation: Secondary | ICD-10-CM | POA: Diagnosis not present

## 2018-06-20 DIAGNOSIS — E755 Other lipid storage disorders: Secondary | ICD-10-CM | POA: Diagnosis not present

## 2018-06-20 NOTE — Progress Notes (Signed)
Chief Complaint: Rectal bleeding, change in bowel habits  Referring Provider:  Darreld Mclean, MD      ASSESSMENT AND PLAN;   #1. H/O Rectal bleeding (resolved). Nl Hb 12.6 05/2018. #2. IBS with alt diarrhea and constipation (s/p chole in past) #3. FH of colon polyps (mom age < 56)  Plan: - Proceed with colonoscopy.  I have discussed the risks and benefits.  The risks including risk of perforation requiring laparotomy, bleeding after polypectomy requiring blood transfusions and risks of anesthesia/sedation were discussed.  Rare risks of missing colorectal neoplasms were also discussed.  Alternatives were given.  Patient is fully aware and agrees to proceed. All the questions were answered. Colonoscopy will be scheduled in upcoming days.  Patient is to report immediately if there is any significant weight loss or excessive bleeding until then. Consent forms were given for review. - Avoid NSAIDs. - Hold off on empiric treatment for now. - Cut down on sodas.   HPI:    Kaitlyn Roach is a 42 y.o. female  S/p chole 2002 due to stones, had postoperative ERCP as well- had diarrhea ever since.  Still with diarrhea especially after she eats fatty meals. Now with occ constipation the last few weeks with pellet-like stools. Had bright red blood per rectum mostly on the tissue and in the toilet bowl x last 3 weeks which is resolved. Mom had polyps - below 74. Bloating -which gets worse intermittently. Has been taking ibuprofen qd for headaches has been drinking 3 Mountain Dew's per day. No chocolates, chewing gums and candy. No weight loss. Denies having any rectal pain or rectal discomfort. Had hemorrhoids over 17 years ago when she was pregnant. Denies having any upper GI symptoms-no nausea, vomiting, heartburn, odynophagia or dysphagia.  Labs reviewed they are all normal including normal liver function tests and CBC.  History reviewed. No pertinent past medical  history.  Past Surgical History:  Procedure Laterality Date  . CHOLECYSTECTOMY    . TUBAL LIGATION      Family History  Problem Relation Age of Onset  . Hyperlipidemia Mother   . Hypertension Mother   . Diabetes Father   . Hyperlipidemia Father   . Hypertension Father   . Breast cancer Maternal Grandmother   . Stomach cancer Paternal Grandfather   . Ovarian cancer Paternal Aunt   . Liver cancer Paternal Aunt   . Colon cancer Neg Hx     Social History   Tobacco Use  . Smoking status: Never Smoker  . Smokeless tobacco: Never Used  Substance Use Topics  . Alcohol use: Yes    Alcohol/week: 1.0 standard drinks    Types: 1 Standard drinks or equivalent per week  . Drug use: Not on file    Current Outpatient Medications  Medication Sig Dispense Refill  . FLUoxetine (PROZAC) 40 MG capsule Take 1 capsule (40 mg total) by mouth daily. (Patient not taking: Reported on 06/14/2018) 90 capsule 3   No current facility-administered medications for this visit.     Allergies  Allergen Reactions  . Codeine     Pt is not sure of reaction, occurred as a child     Review of Systems:  Constitutional: Denies fever, chills, diaphoresis, appetite change and fatigue.  HEENT: Denies photophobia, eye pain, redness, hearing loss, ear pain, congestion, sore throat, rhinorrhea, sneezing, mouth sores, neck pain, neck stiffness and tinnitus.   Respiratory: Denies SOB, DOE, cough, chest tightness,  and wheezing.   Cardiovascular: Denies  chest pain, palpitations and leg swelling.  Genitourinary: Denies dysuria, urgency, frequency, hematuria, flank pain and difficulty urinating.  Musculoskeletal: Denies myalgias, has back pain, No joint swelling, arthralgias and gait problem.  Skin: No rash.  Neurological: Denies dizziness, seizures, syncope, weakness, light-headedness, numbness and has headaches.  Hematological: Denies adenopathy. Easy bruising, personal or family bleeding history   Psychiatric/Behavioral: haS anxiety. No depression     Physical Exam:    BP (!) 132/92   Pulse 79   Ht 5\' 1"  (1.549 m)   Wt 198 lb 6 oz (90 kg)   LMP 06/04/2018   SpO2 98%   BMI 37.48 kg/m  Filed Weights   06/20/18 0924  Weight: 198 lb 6 oz (90 kg)   Constitutional:  Well-developed, in no acute distress. Psychiatric: Normal mood and affect. Behavior is normal. HEENT: Pupils normal.  Conjunctivae are normal. No scleral icterus. Cardiovascular: Normal rate, regular rhythm. No edema Pulmonary/chest: Effort normal and breath sounds normal. No wheezing, rales or rhonchi. Abdominal: Soft, nondistended. Nontender. Bowel sounds active throughout. There are no masses palpable. No hepatomegaly. Rectal: To be performed at the time of colonoscopy Neurological: Alert and oriented to person place and time. Skin: Skin is warm and dry. No rashes noted.  Data Reviewed: I have personally reviewed following labs and imaging studies  CBC: CBC Latest Ref Rng & Units 06/14/2018 05/17/2017 01/18/2016  WBC 4.0 - 10.5 K/uL 9.7 9.4 9.6  Hemoglobin 12.0 - 15.0 g/dL 12.6 13.2 13.0  Hematocrit 36.0 - 46.0 % 37.6 40.3 39.2  Platelets 150.0 - 400.0 K/uL 232.0 224.0 226.0    CMP: CMP Latest Ref Rng & Units 06/14/2018 05/17/2017 01/18/2016  Glucose 70 - 99 mg/dL 83 77 91  BUN 6 - 23 mg/dL 8 7 8   Creatinine 0.40 - 1.20 mg/dL 0.64 0.63 0.62  Sodium 135 - 145 mEq/L 139 138 137  Potassium 3.5 - 5.1 mEq/L 3.7 4.1 3.7  Chloride 96 - 112 mEq/L 103 102 103  CO2 19 - 32 mEq/L 25 28 25   Calcium 8.4 - 10.5 mg/dL 9.1 9.4 9.2  Total Protein 6.0 - 8.3 g/dL 6.8 7.2 6.9  Total Bilirubin 0.2 - 1.2 mg/dL 0.5 0.4 0.3  Alkaline Phos 39 - 117 U/L 92 95 104  AST 0 - 37 U/L 15 17 19   ALT 0 - 35 U/L 18 22 26     GFR: Estimated Creatinine Clearance: 93.6 mL/min (by C-G formula based on SCr of 0.64 mg/dL). Liver Function Tests: Recent Labs  Lab 06/14/18 1525  AST 15  ALT 18  ALKPHOS 92  BILITOT 0.5  PROT 6.8   ALBUMIN 4.2  Labs reviewed with the patient.   Carmell Austria, MD 06/20/2018, 9:31 AM  Cc: Darreld Mclean, MD

## 2018-06-20 NOTE — Patient Instructions (Signed)
If you are age 42 or older, your body mass index should be between 23-30. Your Body mass index is 37.48 kg/m. If this is out of the aforementioned range listed, please consider follow up with your Primary Care Provider.  If you are age 25 or younger, your body mass index should be between 19-25. Your Body mass index is 37.48 kg/m. If this is out of the aformentioned range listed, please consider follow up with your Primary Care Provider.   You have been scheduled for a colonoscopy. Please follow written instructions given to you at your visit today.  Please pick up your prep supplies at the pharmacy within the next 1-3 days. If you use inhalers (even only as needed), please bring them with you on the day of your procedure. Your physician has requested that you go to www.startemmi.com and enter the access code given to you at your visit today. This web site gives a general overview about your procedure. However, you should still follow specific instructions given to you by our office regarding your preparation for the procedure.  Thank you,  Dr. Jackquline Denmark

## 2018-07-20 ENCOUNTER — Encounter: Payer: Self-pay | Admitting: Gastroenterology

## 2018-08-03 ENCOUNTER — Encounter: Payer: 59 | Admitting: Gastroenterology

## 2018-11-28 ENCOUNTER — Other Ambulatory Visit: Payer: Self-pay | Admitting: Family Medicine

## 2018-11-28 DIAGNOSIS — Z1231 Encounter for screening mammogram for malignant neoplasm of breast: Secondary | ICD-10-CM

## 2019-02-01 ENCOUNTER — Other Ambulatory Visit: Payer: Self-pay

## 2019-02-01 ENCOUNTER — Ambulatory Visit
Admission: RE | Admit: 2019-02-01 | Discharge: 2019-02-01 | Disposition: A | Discharge status: Home / Self Care | Payer: 59 | Source: Ambulatory Visit | Attending: Family Medicine | Admitting: Family Medicine

## 2019-02-01 DIAGNOSIS — Z1231 Encounter for screening mammogram for malignant neoplasm of breast: Secondary | ICD-10-CM

## 2019-06-01 NOTE — Progress Notes (Deleted)
[place] at [facility] [address] [place], [place] [address] [phone]  Date:  06/05/2019   Name:  Kaitlyn Roach   DOB:  May 26, 1976   MRN:  [id number]  PCP:  Kaitlyn Mclean, MD    Chief Complaint: No chief complaint on file.   History of Present Illness:  Kaitlyn Roach is a 43 y.o. very pleasant female patient who presents with the following:  Patient with history of obesity, otherwise generally healthy.  Here today for physical Last seen by myself about a year ago, at which time she had complained of bowel habit changing and occasional blood on toilet paper.  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 Obesity, unspecified 01/18/2016  . Bilateral thoracic back pain 11/02/2015  . Macromastia 11/02/2015    No past medical history on file.  Past Surgical History:  Procedure Laterality Date  . CHOLECYSTECTOMY    . TUBAL LIGATION      Social History   Tobacco Use  . Smoking status: Never Smoker  . Smokeless tobacco: Never Used  Substance Use Topics  . Alcohol use: Yes    Alcohol/week: 1.0 standard drinks    Types: 1 Standard drinks or equivalent per week  . Drug use: Not on file    Family History  Problem Relation Age of Onset  . Hyperlipidemia Mother   . Hypertension Mother   . Diabetes Father   . Hyperlipidemia Father   . Hypertension Father   . Breast cancer Maternal Grandmother   . Stomach cancer Paternal Grandfather   . Ovarian cancer Paternal Aunt   . Liver cancer Paternal Aunt   . Colon cancer Neg Hx     Allergies  Allergen Reactions  . Codeine     Pt is not sure of  reaction, occurred as a child     Medication list has been reviewed and updated.  Current Outpatient Medications on File Prior to Visit  Medication Sig Dispense Refill  . FLUoxetine (PROZAC) 40 MG capsule Take 1 capsule (40 mg total) by mouth daily. (Patient not taking: Reported on 06/14/2018) 90 capsule 3   No current facility-administered medications on file prior to visit.     Review of Systems:  As per HPI- otherwise negative.   Physical Examination: There were no vitals filed for this visit. There were no vitals filed for this visit. There is no height or weight on file to calculate BMI. Ideal Body Weight:    GEN: WDWN, NAD, Non-toxic, A & O x 3 HEENT: Atraumatic, Normocephalic. Neck supple. No masses, No LAD. Ears and Nose: No external deformity. CV: RRR, No M/G/R. No JVD. No thrill. No extra heart sounds. PULM: CTA B, no wheezes, crackles, rhonchi. No retractions. No resp. distress. No accessory muscle use. ABD: S, NT, ND, +BS. No rebound. No HSM. EXTR: No c/c/e NEURO Normal gait.  PSYCH: Normally interactive. Conversant. Not depressed or anxious appearing.  Calm demeanor.    Assessment and Plan: ***  Signed Lamar Blinks, MD

## 2019-06-03 ENCOUNTER — Other Ambulatory Visit: Payer: Self-pay

## 2019-06-03 DIAGNOSIS — Z20822 Contact with and (suspected) exposure to covid-19: Secondary | ICD-10-CM

## 2019-06-05 ENCOUNTER — Ambulatory Visit (INDEPENDENT_AMBULATORY_CARE_PROVIDER_SITE_OTHER): Payer: 59 | Admitting: Family Medicine

## 2019-06-05 ENCOUNTER — Other Ambulatory Visit: Payer: Self-pay | Admitting: Family Medicine

## 2019-06-05 ENCOUNTER — Encounter: Payer: Self-pay | Admitting: Family Medicine

## 2019-06-05 ENCOUNTER — Other Ambulatory Visit: Payer: Self-pay

## 2019-06-05 DIAGNOSIS — Z13220 Encounter for screening for lipoid disorders: Secondary | ICD-10-CM

## 2019-06-05 DIAGNOSIS — Z130 Encounter for screening for diseases of the blood and blood-forming organs and certain disorders involving the immune mechanism: Secondary | ICD-10-CM

## 2019-06-05 DIAGNOSIS — J0110 Acute frontal sinusitis, unspecified: Secondary | ICD-10-CM | POA: Diagnosis not present

## 2019-06-05 DIAGNOSIS — Z1329 Encounter for screening for other suspected endocrine disorder: Secondary | ICD-10-CM

## 2019-06-05 DIAGNOSIS — Z114 Encounter for screening for human immunodeficiency virus [HIV]: Secondary | ICD-10-CM

## 2019-06-05 DIAGNOSIS — Z0000 Encounter for general adult medical examination without abnormal findings: Secondary | ICD-10-CM

## 2019-06-05 DIAGNOSIS — Z131 Encounter for screening for diabetes mellitus: Secondary | ICD-10-CM

## 2019-06-05 LAB — NOVEL CORONAVIRUS, NAA: SARS-CoV-2, NAA: NOT DETECTED

## 2019-06-05 MED ORDER — AMOXICILLIN 500 MG PO CAPS: 1000.0000 mg | ORAL_CAPSULE | Freq: Two times a day (BID) | ORAL | 0 refills | Status: DC

## 2019-06-05 MED FILL — AMOXICILLIN 500 MG CAPSULE: 500 | 10 days supply | Qty: 40 | Fill #0

## 2019-06-05 NOTE — Progress Notes (Signed)
[place] at [facility] [address], [place], [place] [address] [phone]  Date:  06/05/2019   Name:  Kaitlyn Roach   DOB:  1975/12/25   MRN:  [id number]  PCP:  Darreld Mclean, MD    Chief Complaint: No chief complaint on file.   History of Present Illness:  Kaitlyn Roach is a 43 y.o. very pleasant female patient who presents with the following:  Virtual visit today for a sick visit We had planned a CPE today but had to change to a virtual visit due to pt illness  Pt location is home, provider is at office Pt ID confirmed with 2 factors, she gives consent for virtual visit today  She notes some sinus congestion for the last 4 days or so She has been tested for covid already- she got tested this week, negative result on chart dated 10/19 No cough noted  No fever   No ST Occasional ear pain   This seems like a sinus infection which she has had in the past No GI symptoms-no nausea, vomiting, diarrhea No rash No sick contacts    Patient Active Problem List   Diagnosis Date Noted  . Obesity, unspecified 01/18/2016  . Bilateral thoracic back pain 11/02/2015  . Macromastia 11/02/2015    No past medical history on file.  Past Surgical History:  Procedure Laterality Date  . CHOLECYSTECTOMY    . TUBAL LIGATION      Social History   Tobacco Use  . Smoking status: Never Smoker  . Smokeless tobacco: Never Used  Substance Use Topics  . Alcohol use: Yes    Alcohol/week: 1.0 standard drinks    Types: 1 Standard drinks or equivalent per week  . Drug use: Not on file    Family History  Problem Relation Age of Onset  . Hyperlipidemia Mother   . Hypertension Mother   . Diabetes Father   . Hyperlipidemia Father   . Hypertension Father   . Breast cancer Maternal Grandmother   . Stomach cancer Paternal Grandfather   . Ovarian cancer Paternal Aunt   . Liver cancer Paternal Aunt   . Colon cancer Neg  Hx     Allergies  Allergen Reactions  . Codeine     Pt is not sure of reaction, occurred as a child     Medication list has been reviewed and updated.  Current Outpatient Medications on File Prior to Visit  Medication Sig Dispense Refill  . FLUoxetine (PROZAC) 40 MG capsule Take 1 capsule (40 mg total) by mouth daily. (Patient not taking: Reported on 06/14/2018) 90 capsule 3   No current facility-administered medications on file prior to visit.     Review of Systems:  As per HPI- otherwise negative.  No fever or chills Physical Examination: There were no vitals filed for this visit. There were no vitals filed for this visit. There is no height or weight on file to calculate BMI. Ideal Body Weight:    Pt observed over video -she appears well, no cough, wheezing, distress is noted She notes pain and pressure in her forehead No tooth pain No pain with bending down   She is not checking any vitals at home   Assessment and Plan: Acute non-recurrent frontal sinusitis - Plan: amoxicillin (AMOXIL) 500 MG capsule  Treat for acute sinusitis today with amoxicillin.  Take 1000 mg twice daily for 10 days-asked her to let me know if  not feeling better soon.  We will reschedule her physical Needs a note for her job as well -sent her via [place], [place]

## 2019-07-07 NOTE — Progress Notes (Addendum)
[place] at [facility] [address], [place], [place] [address] [phone]  Date:  07/08/2019   Name:  Kaitlyn Roach   DOB:  April 17, 1976   MRN:  [id number]  PCP:  Darreld Mclean, MD    Chief Complaint: Annual Exam   History of Present Illness:  Kaitlyn Roach is a 43 y.o. 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  Her oldest is working and attending cosmetology school.  Her son is in HS   I have treated her fluoxetine for depression in the past, but she is no longer taking it She notes that she is having some anxiety and difficulty concentrating at work She notes that it takes her longer to finish her work as she is feeling anxious  She felt ok with prozac but it made her feel sleepy/ "out of it"  Not feeling so much depressed, just more anxious.  No suicidal ideation She is not sleeping well most of the time- she is feeling worried and will lay awake most nights of the week Financially they are doing ok- she is still working  She is not aware of any particular stressors, just feels more anxious recently  Flu vaccine- do today Pap 2018-negative Tetanus up-to-date Routine blood work is due- she is fasting except for regular mtn dew this morning Negative COVID-19 test last month mammo UTD  Admits she is not exercising much   Wt Readings from Last 3 Encounters:  07/08/19 199 lb (90.3 kg)  06/20/18 198 lb 6 oz (90 kg)  06/14/18 202 lb (91.6 kg)   BP Readings from Last 3 Encounters:  07/08/19 122/88  06/20/18 (!) 132/92  06/14/18 128/90     Patient Active Problem List   Diagnosis Date Noted  . Obesity, unspecified 01/18/2016  . Bilateral thoracic back pain 11/02/2015  .  Macromastia 11/02/2015    History reviewed. No pertinent past medical history.  Past Surgical History:  Procedure Laterality Date  . CHOLECYSTECTOMY    . TUBAL LIGATION      Social History   Tobacco Use  . Smoking status: Never Smoker  . Smokeless tobacco: Never Used  Substance Use Topics  . Alcohol use: Yes    Alcohol/week: 1.0 standard drinks    Types: 1 Standard drinks or equivalent per week  . Drug use: Not on file    Family History  Problem Relation Age of Onset  . Hyperlipidemia Mother   . Hypertension Mother   . Diabetes Father   . Hyperlipidemia Father   . Hypertension Father   . Breast cancer Maternal Grandmother   . Stomach cancer Paternal Grandfather   . Ovarian cancer Paternal Aunt   . Liver cancer Paternal Aunt   . Colon cancer Neg Hx     Allergies  Allergen Reactions  . Codeine     Pt is not sure of reaction, occurred as a child     Medication list has been reviewed and updated.  No current outpatient medications on file prior to visit.   No current facility-administered medications on file prior to visit.     Review of Systems:  As per HPI- otherwise negative.  No fever or chills,  no chest pain or shortness of breath Physical Examination: Vitals:   07/08/19 0900  BP: 122/88  Pulse: 80  Resp: 16  Temp: 97.6 F (36.4 C)  SpO2: 100%   Vitals:   07/08/19 0900  Weight: 199 lb (90.3 kg)  Height: 5\' 1"  (1.549 m)   Body mass index is 37.6 kg/m. Ideal Body Weight: Weight in (lb) to have BMI = 25: 132  GEN: WDWN, NAD, Non-toxic, A & O x 3, obese, looks well HEENT: Atraumatic, Normocephalic. Neck supple. No masses, No LAD.  TM within normal limits, PERRL Ears and Nose: No external deformity. CV: RRR, No M/G/R. No JVD. No thrill. No extra heart sounds. PULM: CTA B, no wheezes, crackles, rhonchi. No retractions. No resp. distress. No accessory muscle use. ABD: S, NT, ND, +BS. No rebound. No HSM. EXTR: No c/c/e NEURO Normal gait.   PSYCH: Normally interactive. Conversant. Not depressed or anxious appearing.  Calm demeanor.    Assessment and Plan: Physical exam  Screening for hyperlipidemia - Plan: Lipid panel  Screening for HIV (human immunodeficiency virus) - Plan: HIV antibody  Screening for thyroid disorder - Plan: TSH  Screening for diabetes mellitus - Plan: Comprehensive metabolic panel, Hemoglobin A1c  Screening for deficiency anemia - Plan: CBC  GAD (generalized anxiety disorder) - Plan: venlafaxine XR (EFFEXOR XR) 75 MG 24 hr capsule  Needs flu shot - Plan: Flu Vaccine QUAD 6+ mos PF IM (Fluarix Quad PF)  Here today for complete physical Labs pending as above Flu shot given Kaitlyn Roach has felt more anxious recently.  She is not feeling so much depressed.  She used Prozac in the past but felt that it made her sleepy.  We discussed options and decided to try venlafaxine ER 75 mg. Also suggested increased exercise, and perhaps melatonin at bedtime for sleep See patient instructions for more details.      This visit occurred during the SARS-CoV-2 public health emergency.  Safety protocols were in place, including screening questions prior to the visit, additional usage of staff PPE, and extensive cleaning of exam room while observing appropriate contact time as indicated for disinfecting solutions.    Signed Lamar Blinks, MD  Received her labs, message to patient  Results for orders placed or performed in visit on 07/08/19  CBC  Result Value Ref Range   WBC 8.2 4.0 - 10.5 K/uL   RBC 4.63 3.87 - 5.11 Mil/uL   Platelets 206.0 150.0 - 400.0 K/uL   Hemoglobin 12.9 12.0 - 15.0 g/dL   HCT 38.4 36.0 - 46.0 %   MCV 82.9 78.0 - 100.0 fl   MCHC 33.7 30.0 - 36.0 g/dL   RDW 14.3 11.5 - 15.5 %  Comprehensive metabolic panel  Result Value Ref Range   Sodium 138 135 - 145 mEq/L   Potassium 4.1 3.5 - 5.1 mEq/L   Chloride 105 96 - 112 mEq/L   CO2 25 19 - 32 mEq/L   Glucose, Bld 88 70 - 99 mg/dL    BUN 10 6 - 23 mg/dL   Creatinine, Ser 0.68 0.40 - 1.20 mg/dL   Total Bilirubin 0.5 0.2 - 1.2 mg/dL   Alkaline Phosphatase 96 39 - 117 U/L   AST 14 0 - 37 U/L   ALT 19 0 - 35 U/L   Total Protein 6.8 6.0 - 8.3 g/dL   Albumin 3.9 3.5 - 5.2 g/dL   GFR 94.22 >60.00 mL/min   Calcium 9.0 8.4 - 10.5 mg/dL  Hemoglobin A1c  Result Value Ref Range   Hgb A1c MFr Bld 5.2 4.6 - 6.5 %  Lipid panel  Result Value Ref Range   Cholesterol 214 (H) 0 - 200 mg/dL   Triglycerides 114.0 0.0 - 149.0 mg/dL   HDL 40.30 >39.00 mg/dL   VLDL 22.8 0.0 - 40.0 mg/dL   LDL Cholesterol 151 (H) 0 - 99 mg/dL   Total CHOL/HDL Ratio 5    NonHDL 173.77   TSH  Result Value Ref Range   TSH 0.69 0.35 - 4.50 uIU/mL

## 2019-07-07 NOTE — Patient Instructions (Addendum)
It was great to see you again today, I will be in touch with your labs ASAP You got your flu shot today Pap due next year Let's try adding effexor xr 75 mg for anxiety. Please let me know how this works for you- you can send me a mychart message or do a virtual visit in a month or so.  We can increase the dose in a few weeks if needed- however let me know sooner if any adverse side effects   I would suggest melatonin for sleep Please work on getting more exercise for health and stress relief. Walking outdoors 30- 60 minutes a day can really help!    Health Maintenance, Female Adopting a healthy lifestyle and getting preventive care are important in promoting health and wellness. Ask your health care provider about:  The right schedule for you to have regular tests and exams.  Things you can do on your own to prevent diseases and keep yourself healthy. What should I know about diet, weight, and exercise? Eat a healthy diet   Eat a diet that includes plenty of vegetables, fruits, low-fat dairy products, and lean protein.  Do not eat a lot of foods that are high in solid fats, added sugars, or sodium. Maintain a healthy weight Body mass index (BMI) is used to identify weight problems. It estimates body fat based on height and weight. Your health care provider can help determine your BMI and help you achieve or maintain a healthy weight. Get regular exercise Get regular exercise. This is one of the most important things you can do for your health. Most adults should:  Exercise for at least 150 minutes each week. The exercise should increase your heart rate and make you sweat (moderate-intensity exercise).  Do strengthening exercises at least twice a week. This is in addition to the moderate-intensity exercise.  Spend less time sitting. Even light physical activity can be beneficial. Watch cholesterol and blood lipids Have your blood tested for lipids and cholesterol at 43 years of age,  then have this test every 5 years. Have your cholesterol levels checked more often if:  Your lipid or cholesterol levels are high.  You are older than 43 years of age.  You are at high risk for heart disease. What should I know about cancer screening? Depending on your health history and family history, you may need to have cancer screening at various ages. This may include screening for:  Breast cancer.  Cervical cancer.  Colorectal cancer.  Skin cancer.  Lung cancer. What should I know about heart disease, diabetes, and high blood pressure? Blood pressure and heart disease  High blood pressure causes heart disease and increases the risk of stroke. This is more likely to develop in people who have high blood pressure readings, are of African descent, or are overweight.  Have your blood pressure checked: ? Every 3-5 years if you are 15-40 years of age. ? Every year if you are 53 years old or older. Diabetes Have regular diabetes screenings. This checks your fasting blood sugar level. Have the screening done:  Once every three years after age 65 if you are at a normal weight and have a low risk for diabetes.  More often and at a younger age if you are overweight or have a high risk for diabetes. What should I know about preventing infection? Hepatitis B If you have a higher risk for hepatitis B, you should be screened for this virus. Talk with your health  care provider to find out if you are at risk for hepatitis B infection. Hepatitis C Testing is recommended for:  Everyone born from [birth year] through 1965.  Anyone with known risk factors for hepatitis C. Sexually transmitted infections (STIs)  Get screened for STIs, including gonorrhea and chlamydia, if: ? You are sexually active and are younger than 43 years of age. ? You are older than 43 years of age and your health care provider tells you that you are at risk for this type of infection. ? Your sexual activity has  changed since you were last screened, and you are at increased risk for chlamydia or gonorrhea. Ask your health care provider if you are at risk.  Ask your health care provider about whether you are at high risk for HIV. Your health care provider may recommend a prescription medicine to help prevent HIV infection. If you choose to take medicine to prevent HIV, you should first get tested for HIV. You should then be tested every 3 months for as long as you are taking the medicine. Pregnancy  If you are about to stop having your period (premenopausal) and you may become pregnant, seek counseling before you get pregnant.  Take 400 to 800 micrograms (mcg) of folic acid every day if you become pregnant.  Ask for birth control (contraception) if you want to prevent pregnancy. Osteoporosis and menopause Osteoporosis is a disease in which the bones lose minerals and strength with aging. This can result in bone fractures. If you are 52 years old or older, or if you are at risk for osteoporosis and fractures, ask your health care provider if you should:  Be screened for bone loss.  Take a calcium or vitamin D supplement to lower your risk of fractures.  Be given hormone replacement therapy (HRT) to treat symptoms of menopause. Follow these instructions at home: Lifestyle  Do not use any products that contain nicotine or tobacco, such as cigarettes, e-cigarettes, and chewing tobacco. If you need help quitting, ask your health care provider.  Do not use street drugs.  Do not share needles.  Ask your health care provider for help if you need support or information about quitting drugs. Alcohol use  Do not drink alcohol if: ? Your health care provider tells you not to drink. ? You are pregnant, may be pregnant, or are planning to become pregnant.  If you drink alcohol: ? Limit how much you use to 0-1 drink a day. ? Limit intake if you are breastfeeding.  Be aware of how much alcohol is in  your drink. In the [place] one drink equals one 12 oz bottle of beer (355 mL), one 5 oz glass of wine (148 mL), or one 1 oz glass of hard liquor (44 mL). General instructions  Schedule regular health, dental, and eye exams.  Stay current with your vaccines.  Tell your health care provider if: ? You often feel depressed. ? You have ever been abused or do not feel safe at home. Summary  Adopting a healthy lifestyle and getting preventive care are important in promoting health and wellness.  Follow your health care provider's instructions about healthy diet, exercising, and getting tested or screened for diseases.  Follow your health care provider's instructions on monitoring your cholesterol and blood pressure. This information is not intended to replace advice given to you by your health care provider. Make sure you discuss any questions you have with your health care provider. Document Released: 02/14/2011 Document Revised: 07/25/2018  Document Reviewed: 07/25/2018 Elsevier Patient Education  [employer].

## 2019-07-08 ENCOUNTER — Encounter: Payer: Self-pay | Admitting: Family Medicine

## 2019-07-08 ENCOUNTER — Other Ambulatory Visit: Payer: Self-pay

## 2019-07-08 ENCOUNTER — Ambulatory Visit (INDEPENDENT_AMBULATORY_CARE_PROVIDER_SITE_OTHER): Payer: 59 | Admitting: Family Medicine

## 2019-07-08 VITALS — BP 122/88 | HR 80 | Temp 97.6°F | Resp 16 | Ht 61.0 in | Wt 199.0 lb

## 2019-07-08 DIAGNOSIS — Z23 Encounter for immunization: Secondary | ICD-10-CM

## 2019-07-08 DIAGNOSIS — Z1329 Encounter for screening for other suspected endocrine disorder: Secondary | ICD-10-CM | POA: Diagnosis not present

## 2019-07-08 DIAGNOSIS — Z0000 Encounter for general adult medical examination without abnormal findings: Secondary | ICD-10-CM

## 2019-07-08 DIAGNOSIS — Z114 Encounter for screening for human immunodeficiency virus [HIV]: Secondary | ICD-10-CM

## 2019-07-08 DIAGNOSIS — Z13220 Encounter for screening for lipoid disorders: Secondary | ICD-10-CM | POA: Diagnosis not present

## 2019-07-08 DIAGNOSIS — Z131 Encounter for screening for diabetes mellitus: Secondary | ICD-10-CM | POA: Diagnosis not present

## 2019-07-08 DIAGNOSIS — Z130 Encounter for screening for diseases of the blood and blood-forming organs and certain disorders involving the immune mechanism: Secondary | ICD-10-CM

## 2019-07-08 DIAGNOSIS — F411 Generalized anxiety disorder: Secondary | ICD-10-CM

## 2019-07-08 LAB — COMPREHENSIVE METABOLIC PANEL
ALT: 19 U/L (ref 0–35)
AST: 14 U/L (ref 0–37)
Albumin: 3.9 g/dL (ref 3.5–5.2)
Alkaline Phosphatase: 96 U/L (ref 39–117)
BUN: 10 mg/dL (ref 6–23)
CO2: 25 mEq/L (ref 19–32)
Calcium: 9 mg/dL (ref 8.4–10.5)
Chloride: 105 mEq/L (ref 96–112)
Creatinine, Ser: 0.68 mg/dL (ref 0.40–1.20)
GFR: 94.22 mL/min (ref >60.00)
Glucose, Bld: 88 mg/dL (ref 70–99)
Potassium: 4.1 mEq/L (ref 3.5–5.1)
Sodium: 138 mEq/L (ref 135–145)
Total Bilirubin: 0.5 mg/dL (ref 0.2–1.2)
Total Protein: 6.8 g/dL (ref 6.0–8.3)

## 2019-07-08 LAB — LIPID PANEL
Cholesterol: 214 mg/dL — ABNORMAL HIGH (ref 0–200)
HDL: 40.3 mg/dL (ref >39.00)
LDL Cholesterol: 151 mg/dL — ABNORMAL HIGH (ref 0–99)
NonHDL: 173.77
Total CHOL/HDL Ratio: 5
Triglycerides: 114 mg/dL (ref 0.0–149.0)
VLDL: 22.8 mg/dL (ref 0.0–40.0)

## 2019-07-08 LAB — CBC
HCT: 38.4 % (ref 36.0–46.0)
Hemoglobin: 12.9 g/dL (ref 12.0–15.0)
MCHC: 33.7 g/dL (ref 30.0–36.0)
MCV: 82.9 fl (ref 78.0–100.0)
Platelets: 206 10*3/uL (ref 150.0–400.0)
RBC: 4.63 Mil/uL (ref 3.87–5.11)
RDW: 14.3 % (ref 11.5–15.5)
WBC: 8.2 10*3/uL (ref 4.0–10.5)

## 2019-07-08 LAB — TSH: TSH: 0.69 u[IU]/mL (ref 0.35–4.50)

## 2019-07-08 LAB — HEMOGLOBIN A1C: Hgb A1c MFr Bld: 5.2 % (ref 4.6–6.5)

## 2019-07-08 MED ORDER — VENLAFAXINE HCL ER 75 MG PO CP24: 75.0000 mg | ORAL_CAPSULE | Freq: Every day | ORAL | 5 refills | Status: DC

## 2019-07-08 MED FILL — VENLAFAXINE HCL ER 75 MG CA: 75 | 30 days supply | Qty: 30 | Fill #0

## 2019-07-09 LAB — HIV ANTIBODY (ROUTINE TESTING W REFLEX): HIV 1&2 Ab, 4th Generation: NONREACTIVE

## 2020-01-30 ENCOUNTER — Other Ambulatory Visit: Payer: Self-pay | Admitting: Family Medicine

## 2020-01-30 DIAGNOSIS — Z1231 Encounter for screening mammogram for malignant neoplasm of breast: Secondary | ICD-10-CM

## 2020-02-06 ENCOUNTER — Ambulatory Visit: Payer: 59

## 2020-02-14 ENCOUNTER — Ambulatory Visit: Payer: Managed Care, Other (non HMO)

## 2020-02-24 ENCOUNTER — Ambulatory Visit
Admission: RE | Admit: 2020-02-24 | Discharge: 2020-02-24 | Disposition: A | Discharge status: Home / Self Care | Payer: Managed Care, Other (non HMO) | Source: Ambulatory Visit | Attending: Family Medicine | Admitting: Family Medicine

## 2020-02-24 ENCOUNTER — Other Ambulatory Visit: Payer: Self-pay

## 2020-02-24 DIAGNOSIS — Z1231 Encounter for screening mammogram for malignant neoplasm of breast: Secondary | ICD-10-CM

## 2020-09-02 ENCOUNTER — Encounter: Payer: Self-pay | Admitting: Family Medicine

## 2020-09-03 ENCOUNTER — Other Ambulatory Visit: Payer: Self-pay | Admitting: Family Medicine

## 2020-09-03 MED ORDER — AZITHROMYCIN 250 MG PO TABS: ORAL_TABLET | ORAL | 0 refills | Status: DC

## 2020-09-03 MED FILL — AZITHROMYCIN 250 MG TABLET: 250 | 5 days supply | Qty: 6 | Fill #0

## 2021-01-28 ENCOUNTER — Other Ambulatory Visit: Payer: Self-pay | Admitting: Family Medicine

## 2021-01-28 DIAGNOSIS — Z1231 Encounter for screening mammogram for malignant neoplasm of breast: Secondary | ICD-10-CM

## 2021-03-25 ENCOUNTER — Other Ambulatory Visit: Payer: Self-pay

## 2021-03-25 ENCOUNTER — Ambulatory Visit
Admission: RE | Admit: 2021-03-25 | Discharge: 2021-03-25 | Disposition: A | Discharge status: Home / Self Care | Payer: Managed Care, Other (non HMO) | Source: Ambulatory Visit | Attending: Family Medicine | Admitting: Family Medicine

## 2021-03-25 DIAGNOSIS — Z1231 Encounter for screening mammogram for malignant neoplasm of breast: Secondary | ICD-10-CM

## 2021-07-18 ENCOUNTER — Ambulatory Visit: Admit: 2021-07-18 | Discharge status: Against Medical Advice | Payer: Managed Care, Other (non HMO)

## 2021-07-19 ENCOUNTER — Other Ambulatory Visit: Payer: Self-pay

## 2021-07-19 ENCOUNTER — Ambulatory Visit
Admission: EM | Admit: 2021-07-19 | Discharge: 2021-07-19 | Disposition: A | Discharge status: Home / Self Care | Payer: Managed Care, Other (non HMO) | Attending: Emergency Medicine | Admitting: Emergency Medicine

## 2021-07-19 ENCOUNTER — Other Ambulatory Visit (HOSPITAL_BASED_OUTPATIENT_CLINIC_OR_DEPARTMENT_OTHER): Payer: Self-pay

## 2021-07-19 VITALS — BP 130/88 | HR 81 | Temp 98.7°F | Resp 18

## 2021-07-19 DIAGNOSIS — J9801 Acute bronchospasm: Secondary | ICD-10-CM

## 2021-07-19 MED ORDER — PROMETHAZINE-DM 6.25-15 MG/5ML PO SYRP
5.0000 mL | ORAL_SOLUTION | Freq: Four times a day (QID) | ORAL | 0 refills | Status: DC | PRN
  Filled 2021-07-19: qty 118, 6d supply, fill #0

## 2021-07-19 MED ORDER — METHYLPREDNISOLONE SODIUM SUCC 125 MG IJ SOLR
80.0000 mg | Freq: Once | INTRAMUSCULAR | Status: AC
  Administered 2021-07-19: 80 mg via INTRAMUSCULAR

## 2021-07-19 MED ORDER — METHYLPREDNISOLONE 4 MG PO TBPK
ORAL_TABLET | ORAL | 0 refills | Status: DC
Start: 2021-07-19 — End: 2022-03-28
  Filled 2021-07-19: qty 21, 6d supply, fill #0

## 2021-07-19 NOTE — ED Triage Notes (Signed)
Pt c/o cough (dry), fatigue that began 2 weeks ago.  Congestion, sore throat that started 2 weeks ago but those symptoms are better now.

## 2021-07-19 NOTE — ED Provider Notes (Signed)
[facility]    CSN: [id number] Arrival date & time: 07/19/21  2725    HISTORY  No chief complaint on file.  HPI Kaitlyn Roach is a 45 y.o. female. Pt c/o cough (dry), fatigue that began 2 weeks ago, complains of congestion, sore throat initially but those symptoms have resolved.  Patient states the cough is nonproductive, hacking, dative, repetitive, unpredictable, worse at night.  Worse when she attempts to take multiple deep breaths in and out (such as during physical exam) or when talking or laughing.  Patient denies nausea, vomiting, diarrhea, headache, body ache, chills, fever.  Vital signs are normal on arrival today.  Patient denies history of asthma and allergies.  Denies history of frequent, recurrent prolonged upper respiratory infections such as this episode.  The history is provided by the patient.  History reviewed. No pertinent past medical history. 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    Home Medications    Prior to Admission medications   Medication Sig Start Date End Date Taking? Authorizing Provider  methylPREDNISolone (MEDROL DOSEPAK) 4 MG TBPK tablet Take 6 tablets (24 mg) by mouth on day 1, 5 tabs (20 mg) on day 2, 4 tabs (16 mg) on day 3, 3 tabs (12 mg) on day 4, 2 tabs (8 mg) on day 5, then 1 tab (4 mg) on day 6. 07/19/21  Yes Lynden Oxford Scales, PA-C  promethazine-dextromethorphan (PROMETHAZINE-DM) 6.25-15 MG/5ML syrup Take 5 mLs by mouth 4 (four) times daily as needed for cough. 07/19/21  Yes Lynden Oxford Scales, PA-C   Family History Family History  Problem Relation Age of Onset   Hyperlipidemia Mother    Hypertension Mother    Diabetes Father    Hyperlipidemia Father     Hypertension Father    Breast cancer Paternal Aunt    Ovarian cancer Paternal Aunt    Liver cancer Paternal Aunt    Breast cancer Maternal Grandmother    Stomach cancer Paternal Grandfather    Colon cancer Neg Hx    Social History Social History   Tobacco Use   Smoking status: Never   Smokeless tobacco: Never  Vaping Use   Vaping Use: Never used  Substance Use Topics   Alcohol use: Yes    Alcohol/week: 1.0 standard drink    Types: 1 Standard drinks or equivalent per week   Allergies   Codeine  Review of Systems Review of Systems Pertinent findings noted in history of present illness.   Physical Exam Triage Vital Signs ED Triage Vitals  Enc Vitals Group     BP 06/11/21 0827 (!) 147/82     Pulse Rate 06/11/21 0827 72     Resp 06/11/21 0827 18     Temp 06/11/21 0827 98.3 F (36.8 C)     Temp Source 06/11/21 0827 Oral     SpO2 06/11/21 0827 98 %     Weight --      Height --      Head Circumference --      Peak Flow --      Pain Score 06/11/21 0826 5     Pain Loc --      Pain Edu? --  Excl. in GC? --   No data found.  Updated Vital Signs BP 130/88 (BP Location: Right Arm)   Pulse 81   Temp 98.7 F (37.1 C) (Oral)   Resp 18   SpO2 98%   Physical Exam Vitals and nursing note reviewed.  Constitutional:      General: She is not in acute distress.    Appearance: Normal appearance. She is not ill-appearing.  HENT:     Head: Normocephalic and atraumatic.     Salivary Glands: Right salivary gland is not diffusely enlarged or tender. Left salivary gland is not diffusely enlarged or tender.     Right Ear: Tympanic membrane, ear canal and external ear normal. No drainage. No middle ear effusion. There is no impacted cerumen. Tympanic membrane is not erythematous or bulging.     Left Ear: Tympanic membrane, ear canal and external ear normal. No drainage.  No middle ear effusion. There is no impacted cerumen. Tympanic membrane is not erythematous or bulging.      Nose: Nose normal. No nasal deformity, septal deviation, mucosal edema, congestion or rhinorrhea.     Right Turbinates: Not enlarged, swollen or pale.     Left Turbinates: Not enlarged, swollen or pale.     Right Sinus: No maxillary sinus tenderness or frontal sinus tenderness.     Left Sinus: No maxillary sinus tenderness or frontal sinus tenderness.     Mouth/Throat:     Lips: Pink. No lesions.     Mouth: Mucous membranes are moist. No oral lesions.     Pharynx: Oropharynx is clear. Uvula midline. No posterior oropharyngeal erythema or uvula swelling.     Tonsils: No tonsillar exudate. 0 on the right. 0 on the left.  Eyes:     General: Lids are normal.        Right eye: No discharge.        Left eye: No discharge.     Extraocular Movements: Extraocular movements intact.     Conjunctiva/sclera: Conjunctivae normal.     Right eye: Right conjunctiva is not injected.     Left eye: Left conjunctiva is not injected.  Neck:     Trachea: Trachea and phonation normal.  Cardiovascular:     Rate and Rhythm: Normal rate and regular rhythm.     Pulses: Normal pulses.     Heart sounds: Normal heart sounds. No murmur heard.   No friction rub. No gallop.  Pulmonary:     Effort: Pulmonary effort is normal. No accessory muscle usage, prolonged expiration or respiratory distress.     Breath sounds: Normal breath sounds. No stridor, decreased air movement or transmitted upper airway sounds. No decreased breath sounds, wheezing, rhonchi or rales.     Comments: Bronchospasm with cough Chest:     Chest wall: No tenderness.  Musculoskeletal:        General: Normal range of motion.     Cervical back: Normal range of motion and neck supple. Normal range of motion.  Lymphadenopathy:     Cervical: No cervical adenopathy.  Skin:    General: Skin is warm and dry.     Findings: No erythema or rash.  Neurological:     General: No focal deficit present.     Mental Status: She is alert and oriented to  person, place, and time.  Psychiatric:        Mood and Affect: Mood normal.        Behavior: Behavior normal.    Visual Acuity  Right Eye Distance:   Left Eye Distance:   Bilateral Distance:    Right Eye Near:   Left Eye Near:    Bilateral Near:     UC Couse / Diagnostics / Procedures:    EKG  Radiology No results found.  Procedures Procedures (including critical care time)  UC Diagnoses / Final Clinical Impressions(s)   I have reviewed the triage vital signs and the nursing notes.  Pertinent labs & imaging results that were available during my care of the patient were reviewed by me and considered in my medical decision making (see chart for details).   Final diagnoses:  Bronchospasm, acute   Solu-Medrol provided in the office today, Medrol Dosepak and promethazine DM prescribed.  Conservative care recommended.  Follow-up recommendations advised.  ED Prescriptions     Medication Sig Dispense Auth. Provider   promethazine-dextromethorphan (PROMETHAZINE-DM) 6.25-15 MG/5ML syrup Take 5 mLs by mouth 4 (four) times daily as needed for cough. 118 mL Lynden Oxford Scales, PA-C   methylPREDNISolone (MEDROL DOSEPAK) 4 MG TBPK tablet Take 6 tablets (24 mg) by mouth on day 1, 5 tabs (20 mg) on day 2, 4 tabs (16 mg) on day 3, 3 tabs (12 mg) on day 4, 2 tabs (8 mg) on day 5, then 1 tab (4 mg) on day 6. 21 tablet Lynden Oxford Scales, PA-C      PDMP not reviewed this encounter.  Pending results:  Labs Reviewed - No data to display  Medications Ordered in UC: Medications  methylPREDNISolone sodium succinate (SOLU-MEDROL) 125 mg/2 mL injection 80 mg (has no administration in time range)    Disposition Upon Discharge:  Condition: stable for discharge home Home: take medications as prescribed; routine discharge instructions as discussed; follow up as advised.  Patient presented with an acute illness with associated systemic symptoms and significant discomfort requiring  urgent management. In my opinion, this is a condition that a prudent lay person (someone who possesses an average knowledge of health and medicine) may potentially expect to result in complications if not addressed urgently such as respiratory distress, impairment of bodily function or dysfunction of bodily organs.   Routine symptom specific, illness specific and/or disease specific instructions were discussed with the patient and/or caregiver at length.   As such, the patient has been evaluated and assessed, work-up was performed and treatment was provided in alignment with urgent care protocols and evidence based medicine.  Patient/parent/caregiver has been advised that the patient may require follow up for further testing and treatment if the symptoms continue in spite of treatment, as clinically indicated and appropriate.  The patient was tested for COVID-19, Influenza and/or RSV, then the patient/parent/guardian was advised to isolate at home pending the results of his/her diagnostic coronavirus test and potentially longer if they're positive. I have also advised pt that if his/her COVID-19 test returns positive, it's recommended to self-isolate for at least 10 days after symptoms first appeared AND until fever-free for 24 hours without fever reducer AND other symptoms have improved or resolved. Discussed self-isolation recommendations as well as instructions for household member/close contacts as per the [facility] and [place] DHHS, and also gave patient the [place] packet with this information.  Patient/parent/caregiver has been advised to return to the [facility] or PCP in 3-5 days if no better; to PCP or the Emergency Department if new signs and symptoms develop, or if the current signs or symptoms continue to change or worsen for further workup, evaluation and treatment as clinically indicated and appropriate  The patient will follow up with their current PCP if and as advised. If the patient does not currently have  a PCP we will assist them in obtaining one.   The patient may need specialty follow up if the symptoms continue, in spite of conservative treatment and management, for further workup, evaluation, consultation and treatment as clinically indicated and appropriate.  Patient/parent/caregiver verbalized understanding and agreement of plan as discussed.  All questions were addressed during visit.  Please see discharge instructions below for further details of plan.  Discharge Instructions:   Discharge Instructions      For your acute bronchospasm, you received an injection of Solu-Medrol in the office today.  Please follow this up by taking the same steroid, methylprednisolone, in the form of a Dosepak.  Please take 1 row of tablets every day with your breakfast starting tomorrow morning.  For nighttime cough, please feel free to take 5 mL Promethazine DM.  This medication is a nasal decongestion and a cough suppressant in addition to also having the benefit of helping people sleep at night.  Thank you for visiting urgent care today.  Is my hope that you feel much better soon.  If you not had good relief in the next 3 to 5 days, please feel free to return for repeat evaluation and further treatment.        Lynden Oxford [place], [place] 07/19/21 [phone]

## 2021-07-19 NOTE — Discharge Instructions (Signed)
For your acute bronchospasm, you received an injection of Solu-Medrol in the office today.  Please follow this up by taking the same steroid, methylprednisolone, in the form of a Dosepak.  Please take 1 row of tablets every day with your breakfast starting tomorrow morning.  For nighttime cough, please feel free to take 5 mL Promethazine DM.  This medication is a nasal decongestion and a cough suppressant in addition to also having the benefit of helping people sleep at night.  Thank you for visiting urgent care today.  Is my hope that you feel much better soon.  If you not had good relief in the next 3 to 5 days, please feel free to return for repeat evaluation and further treatment.

## 2022-03-02 ENCOUNTER — Telehealth: Payer: Self-pay

## 2022-03-02 ENCOUNTER — Other Ambulatory Visit: Payer: Self-pay | Admitting: Family Medicine

## 2022-03-02 DIAGNOSIS — Z1231 Encounter for screening mammogram for malignant neoplasm of breast: Secondary | ICD-10-CM

## 2022-03-02 NOTE — Telephone Encounter (Signed)
LVM to call back and schedule annual physical.

## 2022-03-14 ENCOUNTER — Ambulatory Visit: Payer: Managed Care, Other (non HMO) | Admitting: Family Medicine

## 2022-03-27 NOTE — Patient Instructions (Addendum)
It was great to see you again today, I will be in touch with your lab work and pap asap We will get you in with a gastroenterologist for a colonoscopy I would recommend getting the covid series if not done yet, and a flu shot thsi fall!  Take care

## 2022-03-27 NOTE — Progress Notes (Addendum)
[facility] [address] [place], [place] [address] [phone]  Date:  03/28/2022   Name:  Kaitlyn Roach   DOB:  [birth year]   MRN:  [id number]  PCP:  Pearline Cables, MD    Chief Complaint: Annual Exam (CPE)   History of Present Illness:  Kaitlyn Roach is a 46 y.o. very pleasant female patient who presents with the following:  Patient seen today for physical Most recent visit with myself was in November 2020  Married, she has 2 young adult children She had stopped taking fluoxetine, and was noticing more anxiety and difficulty with concentration-we decided to try Effexor XR She is no longer using the effexor; at this time she declines any other treatment She is using topamax and phentermine for weight loss  Her son recently graduated from high school, he is now working full-time.  Her daughter got married last year  COVID-19 series- recommend  Hepatitis C screening can be done Pap is due; she did have an abnormal years ago but more recently okay Her menses will start soon  Colon cancer screening; no family history.  She has noted some previous symptoms of bowel irregularity and would like to pursue a colonoscopy.  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  Past Surgical History:  Procedure Laterality Date   CHOLECYSTECTOMY     TUBAL LIGATION      Social History   Tobacco Use   Smoking status: Never   Smokeless tobacco: Never  Vaping Use   Vaping Use: Never used  Substance Use Topics   Alcohol use: Yes    Alcohol/week: 1.0 standard drink of alcohol    Types: 1 Standard drinks or equivalent per week     Family History  Problem Relation Age of Onset   Hyperlipidemia Mother    Hypertension Mother    Diabetes Father    Hyperlipidemia Father    Hypertension Father    Breast cancer Paternal Aunt    Ovarian cancer Paternal Aunt    Liver cancer Paternal Aunt    Breast cancer Maternal Grandmother    Stomach cancer Paternal Grandfather    Colon cancer Neg Hx     Allergies  Allergen Reactions   Codeine     Pt is not sure of reaction, occurred as a child     Medication list has been reviewed and updated.  Current Outpatient Medications on File Prior to Visit  Medication Sig Dispense Refill   phentermine (ADIPEX-P) 37.5 MG tablet Take 18.75-37.5 mg by mouth daily.     topiramate (TOPAMAX) 25 MG tablet SMARTSIG:1 Tablet(s) By Mouth Every Evening     No current facility-administered medications on file prior to visit.    Review of Systems:  As per HPI- otherwise negative.   Physical Examination: Vitals:   03/28/22 1412  BP: 110/80  Pulse: 86  Temp: 98.4 F (36.9 C)  SpO2: 99%   Vitals:   03/28/22 1412  Weight: 179 lb 3.2 oz (81.3 kg)  Height: 5\' 1"  (1.549 m)   Body mass index is 33.86 kg/m. Ideal Body Weight: Weight in (lb) to have BMI = 25:  132  GEN: no acute distress.  Obese, looks well HEENT: Atraumatic, Normocephalic. Bilateral TM wnl, oropharynx normal.  PEERL,EOMI.   Ears and Nose: No external deformity. CV: RRR, No M/G/R. No JVD. No thrill. No extra heart sounds. PULM: CTA B, no wheezes, crackles, rhonchi. No retractions. No resp. distress. No accessory muscle use. ABD: S, NT, ND, +BS. No rebound. No HSM. EXTR: No c/c/e PSYCH: Normally interactive. Conversant.  Normal vulva, vagina, cervix  Assessment and Plan: Physical exam  Screening for diabetes mellitus - Plan: Comprehensive metabolic panel, Hemoglobin A1c  Screening for hyperlipidemia - Plan: Lipid panel  Screening for deficiency anemia - Plan: CBC  Screening for thyroid disorder - Plan:  TSH  Encounter for hepatitis C screening test for low risk patient - Plan: Hepatitis C antibody  Screening for cervical cancer - Plan: Cytology - PAP  Screening for colon cancer - Plan: Ambulatory referral to Gastroenterology  Physical exam today.  Encouraged healthy diet and exercise routine Will plan further follow- up pending labs. Referral to GI  Signed Abbe Amsterdam, MD  Received labs as below, message to patient  Results for orders placed or performed in visit on 03/28/22  CBC  Result Value Ref Range   WBC 7.5 4.0 - 10.5 K/uL   RBC 4.69 3.87 - 5.11 Mil/uL   Platelets 177.0 150.0 - 400.0 K/uL   Hemoglobin 12.8 12.0 - 15.0 g/dL   HCT 16.1 09.6 - 04.5 %   MCV 83.7 78.0 - 100.0 fl   MCHC 32.6 30.0 - 36.0 g/dL   RDW 40.9 81.1 - 91.4 %  Comprehensive metabolic panel  Result Value Ref Range   Sodium 139 135 - 145 mEq/L   Potassium 3.6 3.5 - 5.1 mEq/L   Chloride 105 96 - 112 mEq/L   CO2 25 19 - 32 mEq/L   Glucose, Bld 84 70 - 99 mg/dL   BUN 9 6 - 23 mg/dL   Creatinine, Ser 7.82 0.40 - 1.20 mg/dL   Total Bilirubin 0.5 0.2 - 1.2 mg/dL   Alkaline Phosphatase 86 39 - 117 U/L   AST 13 0 - 37 U/L   ALT 15 0 - 35 U/L   Total Protein 6.8 6.0 - 8.3 g/dL   Albumin 4.3 3.5 - 5.2 g/dL   GFR 956.21 >30.86 mL/min   Calcium 9.3 8.4 - 10.5 mg/dL  Hemoglobin V7Q  Result Value Ref Range   Hgb A1c MFr Bld 5.4 4.6 - 6.5 %  Lipid panel  Result Value Ref Range   Cholesterol 187 0 - 200 mg/dL   Triglycerides 469.6 0.0 - 149.0 mg/dL   HDL 29.52 >84.13 mg/dL   VLDL 24.4 0.0 - 01.0 mg/dL   LDL Cholesterol 272 (H) 0 - 99 mg/dL   Total CHOL/HDL Ratio 4    NonHDL 143.49   TSH  Result Value Ref Range   TSH 0.32 (L) 0.35 - 5.50 uIU/mL  Hepatitis C antibody  Result Value Ref Range   Hepatitis C Ab NON-REACTIVE NON-REACTIVE

## 2022-03-28 ENCOUNTER — Ambulatory Visit (INDEPENDENT_AMBULATORY_CARE_PROVIDER_SITE_OTHER): Payer: Managed Care, Other (non HMO) | Admitting: Family Medicine

## 2022-03-28 ENCOUNTER — Other Ambulatory Visit (HOSPITAL_COMMUNITY)
Admission: RE | Admit: 2022-03-28 | Discharge: 2022-03-28 | Disposition: A | Discharge status: Home / Self Care | Payer: Managed Care, Other (non HMO) | Source: Ambulatory Visit | Attending: Family Medicine | Admitting: Family Medicine

## 2022-03-28 ENCOUNTER — Encounter: Payer: Self-pay | Admitting: Family Medicine

## 2022-03-28 VITALS — BP 110/80 | HR 86 | Temp 98.4°F | Ht 61.0 in | Wt 179.2 lb

## 2022-03-28 DIAGNOSIS — Z1211 Encounter for screening for malignant neoplasm of colon: Secondary | ICD-10-CM

## 2022-03-28 DIAGNOSIS — Z124 Encounter for screening for malignant neoplasm of cervix: Secondary | ICD-10-CM

## 2022-03-28 DIAGNOSIS — Z13220 Encounter for screening for lipoid disorders: Secondary | ICD-10-CM | POA: Diagnosis not present

## 2022-03-28 DIAGNOSIS — Z131 Encounter for screening for diabetes mellitus: Secondary | ICD-10-CM | POA: Diagnosis not present

## 2022-03-28 DIAGNOSIS — Z130 Encounter for screening for diseases of the blood and blood-forming organs and certain disorders involving the immune mechanism: Secondary | ICD-10-CM

## 2022-03-28 DIAGNOSIS — Z1159 Encounter for screening for other viral diseases: Secondary | ICD-10-CM

## 2022-03-28 DIAGNOSIS — Z1329 Encounter for screening for other suspected endocrine disorder: Secondary | ICD-10-CM

## 2022-03-28 DIAGNOSIS — Z0000 Encounter for general adult medical examination without abnormal findings: Secondary | ICD-10-CM | POA: Diagnosis not present

## 2022-03-29 ENCOUNTER — Encounter: Payer: Self-pay | Admitting: Family Medicine

## 2022-03-29 LAB — CBC
HCT: 39.3 % (ref 36.0–46.0)
Hemoglobin: 12.8 g/dL (ref 12.0–15.0)
MCHC: 32.6 g/dL (ref 30.0–36.0)
MCV: 83.7 fl (ref 78.0–100.0)
Platelets: 177 10*3/uL (ref 150.0–400.0)
RBC: 4.69 Mil/uL (ref 3.87–5.11)
RDW: 15.1 % (ref 11.5–15.5)
WBC: 7.5 10*3/uL (ref 4.0–10.5)

## 2022-03-29 LAB — COMPREHENSIVE METABOLIC PANEL
ALT: 15 U/L (ref 0–35)
AST: 13 U/L (ref 0–37)
Albumin: 4.3 g/dL (ref 3.5–5.2)
Alkaline Phosphatase: 86 U/L (ref 39–117)
BUN: 9 mg/dL (ref 6–23)
CO2: 25 mEq/L (ref 19–32)
Calcium: 9.3 mg/dL (ref 8.4–10.5)
Chloride: 105 mEq/L (ref 96–112)
Creatinine, Ser: 0.61 mg/dL (ref 0.40–1.20)
GFR: 107.37 mL/min (ref >60.00)
Glucose, Bld: 84 mg/dL (ref 70–99)
Potassium: 3.6 mEq/L (ref 3.5–5.1)
Sodium: 139 mEq/L (ref 135–145)
Total Bilirubin: 0.5 mg/dL (ref 0.2–1.2)
Total Protein: 6.8 g/dL (ref 6.0–8.3)

## 2022-03-29 LAB — LIPID PANEL
Cholesterol: 187 mg/dL (ref 0–200)
HDL: 43.6 mg/dL (ref >39.00)
LDL Cholesterol: 121 mg/dL — ABNORMAL HIGH (ref 0–99)
NonHDL: 143.49
Total CHOL/HDL Ratio: 4
Triglycerides: 114 mg/dL (ref 0.0–149.0)
VLDL: 22.8 mg/dL (ref 0.0–40.0)

## 2022-03-29 LAB — TSH: TSH: 0.32 u[IU]/mL — ABNORMAL LOW (ref 0.35–5.50)

## 2022-03-29 LAB — HEMOGLOBIN A1C: Hgb A1c MFr Bld: 5.4 % (ref 4.6–6.5)

## 2022-03-29 LAB — HEPATITIS C ANTIBODY: Hepatitis C Ab: NONREACTIVE

## 2022-03-29 NOTE — Addendum Note (Signed)
Addended by: Lamar Blinks C on: 03/29/2022 12:12 PM   Modules accepted: Orders

## 2022-03-31 ENCOUNTER — Ambulatory Visit
Admission: RE | Admit: 2022-03-31 | Discharge: 2022-03-31 | Disposition: A | Discharge status: Home / Self Care | Payer: Managed Care, Other (non HMO) | Source: Ambulatory Visit | Attending: Family Medicine | Admitting: Family Medicine

## 2022-03-31 ENCOUNTER — Other Ambulatory Visit (HOSPITAL_COMMUNITY): Payer: Self-pay

## 2022-03-31 DIAGNOSIS — Z1231 Encounter for screening mammogram for malignant neoplasm of breast: Secondary | ICD-10-CM

## 2022-03-31 LAB — CYTOLOGY - PAP
Comment: NEGATIVE
Diagnosis: UNDETERMINED — AB
High risk HPV: NEGATIVE

## 2022-04-01 ENCOUNTER — Encounter: Payer: Self-pay | Admitting: Family Medicine

## 2022-04-11 ENCOUNTER — Encounter: Payer: Self-pay | Admitting: Family Medicine

## 2022-04-11 ENCOUNTER — Other Ambulatory Visit (INDEPENDENT_AMBULATORY_CARE_PROVIDER_SITE_OTHER): Payer: Managed Care, Other (non HMO)

## 2022-04-11 DIAGNOSIS — Z1329 Encounter for screening for other suspected endocrine disorder: Secondary | ICD-10-CM | POA: Diagnosis not present

## 2022-04-11 LAB — TSH: TSH: 0.8 u[IU]/mL (ref 0.35–5.50)

## 2022-04-11 LAB — T3, FREE: T3, Free: 3.4 pg/mL (ref 2.3–4.2)

## 2022-11-30 ENCOUNTER — Other Ambulatory Visit (HOSPITAL_BASED_OUTPATIENT_CLINIC_OR_DEPARTMENT_OTHER): Payer: Self-pay

## 2022-11-30 ENCOUNTER — Ambulatory Visit: Payer: Managed Care, Other (non HMO) | Admitting: Medical

## 2022-11-30 VITALS — BP 130/64 | HR 80 | Temp 98.2°F | Resp 18 | Ht 61.0 in | Wt 183.4 lb

## 2022-11-30 DIAGNOSIS — H6992 Unspecified Eustachian tube disorder, left ear: Secondary | ICD-10-CM | POA: Diagnosis not present

## 2022-11-30 MED ORDER — AMOXICILLIN-POT CLAVULANATE 875-125 MG PO TABS
1.0000 | ORAL_TABLET | Freq: Two times a day (BID) | ORAL | 0 refills | Status: DC
  Filled 2022-11-30: qty 20, 10d supply, fill #0

## 2022-11-30 MED ORDER — FLUTICASONE PROPIONATE 50 MCG/ACT NA SUSP
2.0000 | Freq: Every day | NASAL | 1 refills | Status: DC
  Filled 2022-11-30: qty 16, 30d supply, fill #0

## 2022-11-30 NOTE — Progress Notes (Signed)
Subjective:    Patient ID: Kaitlyn Roach, female    DOB: 04/22/1976, 47 y.o.   MRN: [id number]  HPI  Pt in with one month of rt ear pain. She thought maybe related to tooth. She just saw dentist today and state  teeth no cause for ear pain.   Pt states occasional nasal congestion 2-3 times a week when wakes but on direct correlation with congestion before this pain started.   Pain is off and on. May hurt for 30 minutes and then go away. She notice more when laying down. Pain does not keep her up.  When she was younger had ear infection and had tubes placed.  Lmp- 2 weeks ago.  Last noted nasal congestin this morning. No chronic allergies.   Review of Systems  Constitutional:  Negative for chills, diaphoresis, fatigue and fever.  HENT:  Positive for congestion. Negative for ear pain, mouth sores and sinus pain.        Low level ear pain/pressure.  Respiratory:  Negative for chest tightness, shortness of breath and wheezing.   Cardiovascular:  Negative for chest pain and palpitations.  Gastrointestinal:  Negative for abdominal pain, nausea and rectal pain.  Musculoskeletal:  Negative for back pain, joint swelling and neck pain.    No past medical history on file.   Social History   Socioeconomic History   Marital status: Married    Spouse name: Not on file   Number of children: Not on file   Years of education: Not on file   Highest education level: Not on file  Occupational History   Not on file  Tobacco Use   Smoking status: Never   Smokeless tobacco: Never  Vaping Use   Vaping Use: Never used  Substance and Sexual Activity   Alcohol use: Yes    Alcohol/week: 1.0 standard drink of alcohol    Types: 1 Standard drinks or equivalent per week   Drug use: Not on file   Sexual activity: Not on file  Other Topics Concern   Not on file  Social History Narrative   Not on file   Social Determinants of Health   Financial Resource Strain: Not on file  Food  Insecurity: Not on file  Transportation Needs: Not on file  Physical Activity: Not on file  Stress: Not on file  Social Connections: Not on file  Intimate Partner Violence: Not on file    Past Surgical History:  Procedure Laterality Date   CHOLECYSTECTOMY     TUBAL LIGATION      Family History  Problem Relation Age of Onset   Hyperlipidemia Mother    Hypertension Mother    Diabetes Father    Hyperlipidemia Father    Hypertension Father    Breast cancer Paternal Aunt    Ovarian cancer Paternal Aunt    Liver cancer Paternal Aunt    Breast cancer Maternal Grandmother    Stomach cancer Paternal Grandfather    Colon cancer Neg Hx     Allergies  Allergen Reactions   Codeine     Pt is not sure of reaction, occurred as a child     No current outpatient medications on file prior to visit.   No current facility-administered medications on file prior to visit.    BP 130/64   Pulse 80   Temp 98.2 F (36.8 C)   Resp 18   Ht  (1.549 m)   Wt 183 lb 6.4 oz (83.2 kg)  LMP 11/15/2022   SpO2 99%   BMI 34.65 kg/m        Objective:   Physical Exam  General Mental Status- Alert. General Appearance- Not in acute distress.   Skin General: Color- Normal Color. Moisture- Normal Moisture.  Neck Carotid Arteries- Normal color. Moisture- Normal Moisture. No carotid bruits. No JVD.  Chest and Lung Exam Auscultation: Breath Sounds:-Normal.  Cardiovascular Auscultation:Rythm- Regular. Murmurs & Other Heart Sounds:Auscultation of the heart reveals- No Murmurs.  Abdomen Inspection:-Inspeection Normal. Palpation/Percussion:Note:No mass. Palpation and Percussion of the abdomen reveal- Non Tender, Non Distended + BS, no rebound or guarding.  Neurologic Cranial Nerve exam:- CN III-XII intact(No nystagmus), symmetric smile. Strength:- 5/5 equal and symmetric strength both upper and lower extremities.    Heent- no frontal or maxillary sinus pressure. No pnd. Rt ear  canal clear. Tm mild dull but not red. Left canal partially blocked with wax. Can see bottom portion of the tympanic membrane which looks normal. No mastoid area pain on either side. No tragal tenderness. Canal did not appear swollen on either side as well.     Assessment & Plan:   You left ear intermittent pressure appear likely eustachian tube dysfunction. Rx flonase nasal spray to use 2 spray each nostril nostril once daily.  If you get constant high level pain then can start augmentin. Otherwise not start.  Update me this Tuesday by my chart.  Follow up date to be determined after my chart update.  Esperanza Richters, PA-C

## 2022-11-30 NOTE — Patient Instructions (Addendum)
You left ear intermittent pressure appear likely eustachian tube dysfunction. Rx flonase nasal spray to use 2 spray each nostril nostril once daily.  If you get constant high level pain then can start augmentin. Otherwise not start.  Update me this Tuesday by my chart.  Follow up date to be determined after my chart update.  Eustachian Tube Dysfunction  Eustachian tube dysfunction refers to a condition in which a blockage develops in the narrow passage that connects the middle ear to the back of the nose (eustachian tube). The eustachian tube regulates air pressure in the middle ear by letting air move between the ear and nose. It also helps to drain fluid from the middle ear space. Eustachian tube dysfunction can affect one or both ears. When the eustachian tube does not function properly, air pressure, fluid, or both can build up in the middle ear. What are the causes? This condition occurs when the eustachian tube becomes blocked or cannot open normally. Common causes of this condition include: Ear infections. Colds and other infections that affect the nose, mouth, and throat (upper respiratory tract). Allergies. Irritation from cigarette smoke. Irritation from stomach acid coming up into the esophagus (gastroesophageal reflux). The esophagus is the part of the body that moves food from the mouth to the stomach. Sudden changes in air pressure, such as from descending in an airplane or scuba diving. Abnormal growths in the nose or throat, such as: Growths that line the nose (nasal polyps). Abnormal growth of cells (tumors). Enlarged tissue at the back of the throat (adenoids). What increases the risk? You are more likely to develop this condition if: You smoke. You are overweight. You are a child who has: Certain birth defects of the mouth, such as cleft palate. Large tonsils or adenoids. What are the signs or symptoms? Common symptoms of this condition include: A feeling of  fullness in the ear. Ear pain. Clicking or popping noises in the ear. Ringing in the ear (tinnitus). Hearing loss. Loss of balance. Dizziness. Symptoms may get worse when the air pressure around you changes, such as when you travel to an area of high elevation, fly on an airplane, or go scuba diving. How is this diagnosed? This condition may be diagnosed based on: Your symptoms. A physical exam of your ears, nose, and throat. Tests, such as those that measure: The movement of your eardrum. Your hearing (audiometry). How is this treated? Treatment depends on the cause and severity of your condition. In mild cases, you may relieve your symptoms by moving air into your ears. This is called "popping the ears." In more severe cases, or if you have symptoms of fluid in your ears, treatment may include: Medicines to relieve congestion (decongestants). Medicines that treat allergies (antihistamines). Nasal sprays or ear drops that contain medicines that reduce swelling (steroids). A procedure to drain the fluid in your eardrum. In this procedure, a small tube may be placed in the eardrum to: Drain the fluid. Restore the air in the middle ear space. A procedure to insert a balloon device through the nose to inflate the opening of the eustachian tube (balloon dilation). Follow these instructions at home: Lifestyle Do not do any of the following until your health care provider approves: Travel to high altitudes. Fly in airplanes. Work in a Estate agent or room. Scuba dive. Do not use any products that contain nicotine or tobacco. These products include cigarettes, chewing tobacco, and vaping devices, such as e-cigarettes. If you need help quitting, ask  your health care provider. Keep your ears dry. Wear fitted earplugs during showering and bathing. Dry your ears completely after. General instructions Take over-the-counter and prescription medicines only as told by your health care  provider. Use techniques to help pop your ears as recommended by your health care provider. These may include: Chewing gum. Yawning. Frequent, forceful swallowing. Closing your mouth, holding your nose closed, and gently blowing as if you are trying to blow air out of your nose. Keep all follow-up visits. This is important. Contact a health care provider if: Your symptoms do not go away after treatment. Your symptoms come back after treatment. You are unable to pop your ears. You have: A fever. Pain in your ear. Pain in your head or neck. Fluid draining from your ear. Your hearing suddenly changes. You become very dizzy. You lose your balance. Get help right away if: You have a sudden, severe increase in any of your symptoms. Summary Eustachian tube dysfunction refers to a condition in which a blockage develops in the eustachian tube. It can be caused by ear infections, allergies, inhaled irritants, or abnormal growths in the nose or throat. Symptoms may include ear pain or fullness, hearing loss, or ringing in the ears. Mild cases are treated with techniques to unblock the ears, such as yawning or chewing gum. More severe cases are treated with medicines or procedures. This information is not intended to replace advice given to you by your health care provider. Make sure you discuss any questions you have with your health care provider. Document Revised: 10/12/2020 Document Reviewed: 10/12/2020 Elsevier Patient Education  2023 [employer].

## 2023-01-25 NOTE — Progress Notes (Deleted)
 Healthcare at [facility] [address], [address] [place], [place] [address] [phone]  Date:  01/26/2023   Name:  Kaitlyn Roach   DOB:  1976/04/05   MRN:  [id number]  PCP:  Pearline Cables, MD    Chief Complaint: No chief complaint on file.   History of Present Illness:  Kaitlyn Roach is a 47 y.o. very pleasant female patient who presents with the following:  Patient seen today with concern of ear pain Most recent visit with myself was for physical exam last August  She was also seen by my partner Esperanza Richters, PA-C in April of this year with likely eustachian tube dysfunction.  Recommended Flonase, given prescription for Augmentin to use if symptoms persisted   Colon cancer screening Patient Active Problem List   Diagnosis Date Noted   Obesity, unspecified 01/18/2016   Bilateral thoracic back pain 11/02/2015   Macromastia 11/02/2015    No past medical history on file.  Past Surgical History:  Procedure Laterality Date   CHOLECYSTECTOMY     TUBAL LIGATION      Social History   Tobacco Use   Smoking status: Never   Smokeless tobacco: Never  Vaping Use   Vaping Use: Never used  Substance Use Topics   Alcohol use: Yes    Alcohol/week: 1.0 standard drink of alcohol    Types: 1 Standard drinks or equivalent per week    Family History  Problem Relation Age of Onset   Hyperlipidemia Mother    Hypertension Mother    Diabetes Father    Hyperlipidemia Father    Hypertension Father    Breast cancer Paternal Aunt    Ovarian cancer Paternal Aunt    Liver cancer Paternal Aunt    Breast cancer Maternal Grandmother    Stomach cancer Paternal Grandfather    Colon cancer Neg Hx     Allergies  Allergen Reactions   Codeine     Pt is not sure of reaction, occurred as a child     Medication list has been reviewed and updated.  Current Outpatient Medications on File Prior to Visit  Medication Sig Dispense  Refill   amoxicillin-clavulanate (AUGMENTIN) 875-125 MG tablet Take 1 tablet by mouth 2 (two) times daily. 20 tablet 0   fluticasone (FLONASE) 50 MCG/ACT nasal spray Place 2 sprays into both nostrils daily. 16 g 1   No current facility-administered medications on file prior to visit.    Review of Systems:  As per HPI- otherwise negative.   Physical Examination: There were no vitals filed for this visit. There were no vitals filed for this visit. There is no height or weight on file to calculate BMI. Ideal Body Weight:    GEN: no acute distress. HEENT: Atraumatic, Normocephalic.  Ears and Nose: No external deformity. CV: RRR, No M/G/R. No JVD. No thrill. No extra heart sounds. PULM: CTA B, no wheezes, crackles, rhonchi. No retractions. No resp. distress. No accessory muscle use. ABD: S, NT, ND, +BS. No rebound. No HSM. EXTR: No c/c/e PSYCH: Normally interactive. Conversant.    Assessment and Plan: ***  Signed Abbe Amsterdam, MD

## 2023-01-26 ENCOUNTER — Ambulatory Visit: Payer: Managed Care, Other (non HMO) | Admitting: Family Medicine

## 2023-01-31 ENCOUNTER — Other Ambulatory Visit (HOSPITAL_BASED_OUTPATIENT_CLINIC_OR_DEPARTMENT_OTHER): Payer: Self-pay

## 2023-01-31 MED ORDER — CHLORHEXIDINE GLUCONATE 0.12 % MT SOLN
OROMUCOSAL | 0 refills | Status: DC
  Filled 2023-01-31: qty 473, 16d supply, fill #0

## 2023-02-21 ENCOUNTER — Other Ambulatory Visit: Payer: Self-pay | Admitting: Family Medicine

## 2023-02-21 DIAGNOSIS — Z1231 Encounter for screening mammogram for malignant neoplasm of breast: Secondary | ICD-10-CM

## 2023-03-15 ENCOUNTER — Encounter (INDEPENDENT_AMBULATORY_CARE_PROVIDER_SITE_OTHER): Payer: Self-pay

## 2023-04-04 ENCOUNTER — Ambulatory Visit
Admission: RE | Admit: 2023-04-04 | Discharge: 2023-04-04 | Disposition: A | Discharge status: Home / Self Care | Payer: Managed Care, Other (non HMO) | Source: Ambulatory Visit | Attending: Family Medicine | Admitting: Family Medicine

## 2023-04-04 DIAGNOSIS — Z1231 Encounter for screening mammogram for malignant neoplasm of breast: Secondary | ICD-10-CM

## 2023-04-10 NOTE — Patient Instructions (Signed)
It was great to see you again today, I will be in touch with your results Recommend flu shot, COVID booster this fall  Work on exercise and healthy diet- adding in some strength training is important for bone density as we age  Please consider avoiding tanning beds and being more careful of the sun to help prevent skin cancer

## 2023-04-10 NOTE — Progress Notes (Unsigned)
[facility] [address] [place], [place] [address] [phone]  Date:  04/12/2023   Name:  Kaitlyn Roach   DOB:  [birth year]   MRN:  [id number]  PCP:  Pearline Cables, MD    Chief Complaint: No chief complaint on file.   History of Present Illness:  Kaitlyn Roach is a 47 y.o. very pleasant female patient who presents with the following:  Patient seen today for physical exam Most recent visit with myself was about 1 year ago Some history of anxiety, last year she had taken herself off Effexor and was doing okay  Married to [place], she has 2 young adult children, daughter and son  Pap smear screening-negative HPV, ASCUS last year.  Recommendation is Pap with HPV in 3 years-however, can repeat sooner if she would like Colon cancer screening-I placed referral to GI last year Mammogram completed last week Lab work can be updated today  Patient Active Problem List   Diagnosis Date Noted   Obesity, unspecified 01/18/2016   Bilateral thoracic back pain 11/02/2015   Macromastia 11/02/2015    No past medical history on file.  Past Surgical History:  Procedure Laterality Date   CHOLECYSTECTOMY     TUBAL LIGATION      Social History   Tobacco Use   Smoking status: Never   Smokeless tobacco: Never  Vaping Use   Vaping status: Never Used  Substance Use Topics   Alcohol use: Yes    Alcohol/week: 1.0 standard drink of alcohol    Types: 1 Standard drinks or equivalent per week    Family History  Problem Relation Age of Onset   Hyperlipidemia Mother    Hypertension Mother    Diabetes Father    Hyperlipidemia Father    Hypertension Father    Breast cancer Paternal Aunt    Ovarian cancer Paternal Aunt    Liver cancer Paternal Aunt    Breast cancer Maternal Grandmother    Stomach cancer Paternal Grandfather    Colon cancer Neg Hx     Allergies  Allergen Reactions   Codeine     Pt is not  sure of reaction, occurred as a child     Medication list has been reviewed and updated.  Current Outpatient Medications on File Prior to Visit  Medication Sig Dispense Refill   amoxicillin-clavulanate (AUGMENTIN) 875-125 MG tablet Take 1 tablet by mouth 2 (two) times daily. 20 tablet 0   chlorhexidine (PERIDEX) 0.12 % solution Rinse as directed and spit twice daily. 473 mL 0   fluticasone (FLONASE) 50 MCG/ACT nasal spray Place 2 sprays into both nostrils daily. 16 g 1   No current facility-administered medications on file prior to visit.    Review of Systems:  As per HPI- otherwise negative.   Physical Examination: There were no vitals filed for this visit. There were no vitals filed for this visit. There is no height or weight on file to calculate BMI. Ideal Body Weight:    GEN: no acute distress. HEENT: Atraumatic, Normocephalic.  Ears and Nose: No external deformity. CV: RRR, No M/G/R. No JVD. No thrill. No extra heart sounds. PULM: CTA B, no wheezes, crackles, rhonchi. No retractions. No resp. distress. No accessory muscle use. ABD: S, NT, ND, +BS. No rebound. No HSM. EXTR: No c/c/e PSYCH: Normally interactive. Conversant.    Assessment and Plan: *** Physical exam today.  Encouraged healthy diet and exercise routine  Will plan further follow- up pending labs.  Signed Abbe Amsterdam, MD

## 2023-04-12 ENCOUNTER — Ambulatory Visit (INDEPENDENT_AMBULATORY_CARE_PROVIDER_SITE_OTHER): Payer: Managed Care, Other (non HMO) | Admitting: Family Medicine

## 2023-04-12 ENCOUNTER — Other Ambulatory Visit (HOSPITAL_COMMUNITY)
Admission: RE | Admit: 2023-04-12 | Discharge: 2023-04-12 | Disposition: A | Discharge status: Home / Self Care | Payer: Managed Care, Other (non HMO) | Source: Ambulatory Visit | Attending: Family Medicine | Admitting: Family Medicine

## 2023-04-12 ENCOUNTER — Encounter: Payer: Self-pay | Admitting: Family Medicine

## 2023-04-12 VITALS — BP 122/72 | HR 95 | Temp 98.0°F | Resp 18 | Ht 61.0 in | Wt 173.0 lb

## 2023-04-12 DIAGNOSIS — R87610 Atypical squamous cells of undetermined significance on cytologic smear of cervix (ASC-US): Secondary | ICD-10-CM | POA: Insufficient documentation

## 2023-04-12 DIAGNOSIS — R7989 Other specified abnormal findings of blood chemistry: Secondary | ICD-10-CM

## 2023-04-12 DIAGNOSIS — Z0000 Encounter for general adult medical examination without abnormal findings: Secondary | ICD-10-CM | POA: Diagnosis not present

## 2023-04-12 DIAGNOSIS — Z1329 Encounter for screening for other suspected endocrine disorder: Secondary | ICD-10-CM

## 2023-04-12 DIAGNOSIS — Z131 Encounter for screening for diabetes mellitus: Secondary | ICD-10-CM | POA: Diagnosis not present

## 2023-04-12 DIAGNOSIS — Z13220 Encounter for screening for lipoid disorders: Secondary | ICD-10-CM | POA: Diagnosis not present

## 2023-04-12 DIAGNOSIS — R5383 Other fatigue: Secondary | ICD-10-CM

## 2023-04-12 DIAGNOSIS — Z130 Encounter for screening for diseases of the blood and blood-forming organs and certain disorders involving the immune mechanism: Secondary | ICD-10-CM

## 2023-04-12 DIAGNOSIS — Z1211 Encounter for screening for malignant neoplasm of colon: Secondary | ICD-10-CM

## 2023-04-12 LAB — COMPREHENSIVE METABOLIC PANEL
ALT: 15 U/L (ref 0–35)
AST: 12 U/L (ref 0–37)
Albumin: 4.3 g/dL (ref 3.5–5.2)
Alkaline Phosphatase: 91 U/L (ref 39–117)
BUN: 8 mg/dL (ref 6–23)
CO2: 29 mEq/L (ref 19–32)
Calcium: 9.5 mg/dL (ref 8.4–10.5)
Chloride: 102 mEq/L (ref 96–112)
Creatinine, Ser: 0.73 mg/dL (ref 0.40–1.20)
GFR: 98.05 mL/min (ref >60.00)
Glucose, Bld: 92 mg/dL (ref 70–99)
Potassium: 3.5 mEq/L (ref 3.5–5.1)
Sodium: 138 mEq/L (ref 135–145)
Total Bilirubin: 0.6 mg/dL (ref 0.2–1.2)
Total Protein: 7 g/dL (ref 6.0–8.3)

## 2023-04-12 LAB — LIPID PANEL
Cholesterol: 204 mg/dL — ABNORMAL HIGH (ref 0–200)
HDL: 39.7 mg/dL (ref >39.00)
LDL Cholesterol: 126 mg/dL — ABNORMAL HIGH (ref 0–99)
NonHDL: 164.58
Total CHOL/HDL Ratio: 5
Triglycerides: 195 mg/dL — ABNORMAL HIGH (ref 0.0–149.0)
VLDL: 39 mg/dL (ref 0.0–40.0)

## 2023-04-12 LAB — CBC
HCT: 44.5 % (ref 36.0–46.0)
Hemoglobin: 14.4 g/dL (ref 12.0–15.0)
MCHC: 32.4 g/dL (ref 30.0–36.0)
MCV: 85.5 fl (ref 78.0–100.0)
Platelets: 210 10*3/uL (ref 150.0–400.0)
RBC: 5.21 Mil/uL — ABNORMAL HIGH (ref 3.87–5.11)
RDW: 14.1 % (ref 11.5–15.5)
WBC: 7.2 10*3/uL (ref 4.0–10.5)

## 2023-04-12 LAB — VITAMIN D 25 HYDROXY (VIT D DEFICIENCY, FRACTURES): VITD: 70.4 ng/mL (ref 30.00–100.00)

## 2023-04-12 LAB — TSH: TSH: 0.16 u[IU]/mL — ABNORMAL LOW (ref 0.35–5.50)

## 2023-04-12 LAB — HEMOGLOBIN A1C: Hgb A1c MFr Bld: 4.9 % (ref 4.6–6.5)

## 2023-04-20 ENCOUNTER — Encounter: Payer: Self-pay | Admitting: Family Medicine

## 2023-04-20 LAB — CYTOLOGY - PAP
Adequacy: ABSENT
Comment: NEGATIVE
Diagnosis: NEGATIVE
High risk HPV: NEGATIVE

## 2023-07-21 ENCOUNTER — Other Ambulatory Visit (HOSPITAL_BASED_OUTPATIENT_CLINIC_OR_DEPARTMENT_OTHER): Payer: Self-pay

## 2023-07-21 ENCOUNTER — Ambulatory Visit: Payer: Managed Care, Other (non HMO) | Admitting: Family

## 2023-07-21 ENCOUNTER — Encounter: Payer: Self-pay | Admitting: Family

## 2023-07-21 VITALS — BP 136/84 | HR 97 | Ht 61.0 in | Wt 195.0 lb

## 2023-07-21 DIAGNOSIS — K649 Unspecified hemorrhoids: Secondary | ICD-10-CM | POA: Diagnosis not present

## 2023-07-21 DIAGNOSIS — Z1211 Encounter for screening for malignant neoplasm of colon: Secondary | ICD-10-CM

## 2023-07-21 MED ORDER — HYDROCORTISONE ACETATE 25 MG RE SUPP
25.0000 mg | Freq: Two times a day (BID) | RECTAL | 0 refills | Status: DC | PRN
  Filled 2023-07-21: qty 24, 12d supply, fill #0

## 2023-07-21 NOTE — Progress Notes (Signed)
  Kaitlyn Roach is a 47 y.o. female with the following history as recorded in EpicCare:  Patient Active Problem List   Diagnosis Date Noted   Obesity, unspecified 01/18/2016   Bilateral thoracic back pain 11/02/2015   Macromastia 11/02/2015    Current Outpatient Medications  Medication Sig Dispense Refill   hydrocortisone (ANUSOL-HC) 25 MG suppository Place 1 suppository (25 mg total) rectally 2 (two) times daily as needed for hemorrhoids or anal itching. 24 suppository 0   No current facility-administered medications for this visit.    Allergies: Codeine  No past medical history on file.  Past Surgical History:  Procedure Laterality Date   CHOLECYSTECTOMY     TUBAL LIGATION      Family History  Problem Relation Age of Onset   Hyperlipidemia Mother    Hypertension Mother    Diabetes Father    Hyperlipidemia Father    Hypertension Father    Breast cancer Paternal Aunt    Ovarian cancer Paternal Aunt    Liver cancer Paternal Aunt    Breast cancer Maternal Grandmother    Stomach cancer Paternal Grandfather    Colon cancer Neg Hx     Social History   Tobacco Use   Smoking status: Never   Smokeless tobacco: Never  Substance Use Topics   Alcohol use: Yes    Alcohol/week: 1.0 standard drink of alcohol    Types: 1 Standard drinks or equivalent per week    Subjective:   Concerned about possible hemorrhoid; denies any concerns for constipation; noticed that she "felt an external lump"/ has had some bright red bleeding;  Has been using topical cooling pad/ Preparation H;  Overdue for baseline colonoscopy;     Objective:  Vitals:   07/21/23 1110  BP: 136/84  Pulse: 97  SpO2: 97%  Weight: 195 lb (88.5 kg)  Height: 5\' 1"  (1.549 m)    General: Well developed, well nourished, in no acute distress  Skin : Warm and dry.  Head: Normocephalic and atraumatic  Lungs: Respirations unlabored;  Neurologic: Alert and oriented; speech intact; face symmetrical; moves all  extremities well; CNII-XII intact without focal deficit  Rectal exam- small external hemorrhoid noted  Assessment:  1. Encounter for screening colonoscopy   2. Hemorrhoids, unspecified hemorrhoid type     Plan:  Can use short term trial of Colace; Rx for Anusol [facility] suppositories; referral to GI for baseline colonoscopy; follow up as needed.   No follow-ups on file.  Orders Placed This Encounter  Procedures   Ambulatory referral to Gastroenterology    Referral Priority:   Routine    Referral Type:   Consultation    Referral Reason:   Specialty Services Required    Number of Visits Requested:   1    Requested Prescriptions   Signed Prescriptions Disp Refills   hydrocortisone (ANUSOL-HC) 25 MG suppository 24 suppository 0    Sig: Place 1 suppository (25 mg total) rectally 2 (two) times daily as needed for hemorrhoids or anal itching.

## 2023-07-21 NOTE — Patient Instructions (Signed)
Please also try using OTC Colace ( stool softener) to help with the symptoms. You can use this medication for 5-7 days as needed.

## 2023-10-24 ENCOUNTER — Ambulatory Visit: Admitting: Family

## 2023-10-24 ENCOUNTER — Other Ambulatory Visit (HOSPITAL_BASED_OUTPATIENT_CLINIC_OR_DEPARTMENT_OTHER): Payer: Self-pay

## 2023-10-24 VITALS — BP 128/82 | HR 100 | Temp 98.0°F | Ht 61.0 in | Wt 207.4 lb

## 2023-10-24 DIAGNOSIS — J209 Acute bronchitis, unspecified: Secondary | ICD-10-CM

## 2023-10-24 MED ORDER — AZITHROMYCIN 250 MG PO TABS
ORAL_TABLET | ORAL | 0 refills | Status: DC
  Filled 2023-10-24: qty 6, 5d supply, fill #0

## 2023-10-24 MED ORDER — PROMETHAZINE-DM 6.25-15 MG/5ML PO SYRP
5.0000 mL | ORAL_SOLUTION | Freq: Four times a day (QID) | ORAL | 0 refills | Status: DC | PRN
  Filled 2023-10-24: qty 118, 6d supply, fill #0

## 2023-10-24 NOTE — Progress Notes (Signed)
  Kaitlyn Roach is a 48 y.o. female with the following history as recorded in EpicCare:  Patient Active Problem List   Diagnosis Date Noted   Obesity, unspecified 01/18/2016   Bilateral thoracic back pain 11/02/2015   Macromastia 11/02/2015    Current Outpatient Medications  Medication Sig Dispense Refill   azithromycin (ZITHROMAX) 250 MG tablet Take 2 tablets (500 mg total) by mouth on day 1, and then take 1 tablet (250 mg total) daily on days 2 through 5. 6 tablet 0   promethazine-dextromethorphan (PROMETHAZINE-DM) 6.25-15 MG/5ML syrup Take 5 mLs by mouth 4 (four) times daily as needed. 118 mL 0   No current facility-administered medications for this visit.    Allergies: Codeine  No past medical history on file.  Past Surgical History:  Procedure Laterality Date   CHOLECYSTECTOMY     TUBAL LIGATION      Family History  Problem Relation Age of Onset   Hyperlipidemia Mother    Hypertension Mother    Diabetes Father    Hyperlipidemia Father    Hypertension Father    Breast cancer Paternal Aunt    Ovarian cancer Paternal Aunt    Liver cancer Paternal Aunt    Breast cancer Maternal Grandmother    Stomach cancer Paternal Grandfather    Colon cancer Neg Hx     Social History   Tobacco Use   Smoking status: Never   Smokeless tobacco: Never  Substance Use Topics   Alcohol use: Yes    Alcohol/week: 1.0 standard drink of alcohol    Types: 1 Standard drinks or equivalent per week    Subjective:   Concern for bronchitis; symptoms x 1.5 weeks; + productive cough; no fever, no chest pain or shortness of breath; no OTC medications; symptoms worse in the evening;   Objective:  Vitals:   10/24/23 1057  BP: 128/82  Pulse: 100  Temp: 98 F (36.7 C)  TempSrc: Oral  SpO2: 98%  Weight: 207 lb 6.4 oz (94.1 kg)  Height: 5\' 1"  (1.549 m)    General: Well developed, well nourished, in no acute distress  Skin : Warm and dry.  Head: Normocephalic and atraumatic  Eyes: Sclera  and conjunctiva clear; pupils round and reactive to light; extraocular movements intact  Ears: External normal; canals clear; tympanic membranes normal  Oropharynx: Pink, supple. No suspicious lesions  Neck: Supple without thyromegaly, adenopathy  Lungs: Respirations unlabored; clear to auscultation bilaterally without wheeze, rales, rhonchi  CVS exam: normal rate and regular rhythm.  Neurologic: Alert and oriented; speech intact; face symmetrical; moves all extremities well; CNII-XII intact without focal deficit   Assessment:  1. Acute bronchitis, unspecified organism     Plan:  Rx for Z-pak #1 take as directed/ Rx for Promethazine DM- use as directed for cough; increase fluids, rest and follow up worse, no better.   No follow-ups on file.  No orders of the defined types were placed in this encounter.   Requested Prescriptions   Signed Prescriptions Disp Refills   azithromycin (ZITHROMAX) 250 MG tablet 6 tablet 0    Sig: Take 2 tablets (500 mg total) by mouth on day 1, and then take 1 tablet (250 mg total) daily on days 2 through 5.   promethazine-dextromethorphan (PROMETHAZINE-DM) 6.25-15 MG/5ML syrup 118 mL 0    Sig: Take 5 mLs by mouth 4 (four) times daily as needed.

## 2023-10-31 ENCOUNTER — Other Ambulatory Visit: Payer: Self-pay | Admitting: Family

## 2023-10-31 ENCOUNTER — Encounter: Payer: Self-pay | Admitting: Family

## 2023-10-31 ENCOUNTER — Other Ambulatory Visit (HOSPITAL_BASED_OUTPATIENT_CLINIC_OR_DEPARTMENT_OTHER): Payer: Self-pay

## 2023-10-31 MED ORDER — DOXYCYCLINE HYCLATE 100 MG PO TABS
100.0000 mg | ORAL_TABLET | Freq: Two times a day (BID) | ORAL | 0 refills | Status: DC
  Filled 2023-10-31: qty 14, 7d supply, fill #0

## 2024-02-19 ENCOUNTER — Other Ambulatory Visit: Payer: Self-pay | Admitting: Family Medicine

## 2024-02-19 DIAGNOSIS — Z1231 Encounter for screening mammogram for malignant neoplasm of breast: Secondary | ICD-10-CM

## 2024-03-01 ENCOUNTER — Encounter: Payer: Self-pay | Admitting: Advanced Practice Midwife

## 2024-04-04 ENCOUNTER — Ambulatory Visit
Admission: RE | Admit: 2024-04-04 | Discharge: 2024-04-04 | Disposition: A | Discharge status: Home / Self Care | Source: Ambulatory Visit | Attending: Family Medicine | Admitting: Family Medicine

## 2024-04-04 DIAGNOSIS — Z1231 Encounter for screening mammogram for malignant neoplasm of breast: Secondary | ICD-10-CM

## 2024-04-25 ENCOUNTER — Other Ambulatory Visit (HOSPITAL_BASED_OUTPATIENT_CLINIC_OR_DEPARTMENT_OTHER): Payer: Self-pay

## 2024-04-25 ENCOUNTER — Encounter: Payer: Self-pay | Admitting: Family Medicine

## 2024-04-25 ENCOUNTER — Ambulatory Visit: Admitting: Family Medicine

## 2024-04-25 VITALS — BP 123/68 | HR 85 | Temp 98.1°F | Ht 61.0 in | Wt 198.0 lb

## 2024-04-25 DIAGNOSIS — H6593 Unspecified nonsuppurative otitis media, bilateral: Secondary | ICD-10-CM | POA: Diagnosis not present

## 2024-04-25 DIAGNOSIS — J019 Acute sinusitis, unspecified: Secondary | ICD-10-CM

## 2024-04-25 MED ORDER — AZITHROMYCIN 250 MG PO TABS
ORAL_TABLET | ORAL | 0 refills | Status: AC
  Filled 2024-04-25: qty 6, 5d supply, fill #0

## 2024-04-25 MED ORDER — PREDNISONE 20 MG PO TABS
20.0000 mg | ORAL_TABLET | Freq: Every day | ORAL | 0 refills | Status: AC
  Filled 2024-04-25: qty 5, 5d supply, fill #0

## 2024-04-25 NOTE — Progress Notes (Signed)
 Acute Office Visit  Subjective:     Patient ID: Kaitlyn Roach, female    DOB: 1976-02-01, 48 y.o.   MRN: [id number]  Chief Complaint  Patient presents with   Sinus Problem    Sinus Problem   Patient is in today for sinusitis, ear pressure.   Discussed the use of AI scribe software for clinical note transcription with the patient, who gave verbal consent to proceed.  History of Present Illness Kaitlyn Roach is a 48 year old female who presents with persistent ear pressure following a flu-like illness.  Her symptoms began approximately a week and a half ago with flu-like symptoms, including fever, which have mostly resolved. However, she continues to experience significant pressure in her ears, describing them as feeling 'clogged' and 'stuffed up' every morning. Some sinus pressure, but no significant pain; more frustrating than anything.   No current sinus pain, runny nose, chest pain, trouble breathing, or cough. She experienced fevers at the onset of her illness but not in the past five to six days.  She has been taking over-the-counter Mucinex, Nyquil, and alternating ibuprofen and Tylenol for symptom relief. She has not been on antibiotics recently and is only allergic to codeine. She has previously used prednisone  without issue.           ROS All review of systems negative except what is listed in the HPI      Objective:    BP 123/68   Pulse 85   Temp 98.1 F (36.7 C) (Oral)   Ht 5' 1 (1.549 m)   Wt 198 lb (89.8 kg)   SpO2 99%   BMI 37.41 kg/m    Physical Exam Vitals reviewed.  Constitutional:      Appearance: Normal appearance.  HENT:     Right Ear: A middle ear effusion is present.     Left Ear: A middle ear effusion is present.     Nose: Congestion present.     Right Turbinates: Swollen.     Left Turbinates: Swollen.  Cardiovascular:     Rate and Rhythm: Normal rate and regular rhythm.     Heart sounds: Normal heart sounds.   Pulmonary:     Effort: Pulmonary effort is normal.     Breath sounds: Normal breath sounds.  Musculoskeletal:     Cervical back: Normal range of motion and neck supple.  Skin:    General: Skin is warm and dry.  Neurological:     Mental Status: She is alert and oriented to person, place, and time.  Psychiatric:        Mood and Affect: Mood normal.        Behavior: Behavior normal.        Thought Content: Thought content normal.        Judgment: Judgment normal.            No results found for any visits on 04/25/24.      Assessment & Plan:   Problem List Items Addressed This Visit   None Visit Diagnoses       Acute non-recurrent sinusitis, unspecified location    -  Primary   Relevant Medications   predniSONE  (DELTASONE ) 20 MG tablet   azithromycin  (ZITHROMAX ) 250 MG tablet     Fluid level behind tympanic membrane of both ears       Relevant Medications   predniSONE  (DELTASONE ) 20 MG tablet       Assessment & Plan Eustachian tube dysfunction with  ear pressure and congestion and acute sinusitis Symptoms consistent with Eustachian tube dysfunction and acute sinusitis for 1.5 weeks. Fluid in ears and nasal swelling observed.  - Prescribed low-dose prednisone  to reduce inflammation and relieve ear pressure. - Prescribed Z-Pak if symptoms do not improve over the next 2 days. - Advised to contact office if further assistance needed.     Meds ordered this encounter  Medications   predniSONE  (DELTASONE ) 20 MG tablet    Sig: Take 1 tablet (20 mg total) by mouth daily with breakfast for 5 days.    Dispense:  5 tablet    Refill:  0    Supervising Provider:   DOMENICA BLACKBIRD A [[id number]]   azithromycin  (ZITHROMAX ) 250 MG tablet    Sig: Take 2 tablets on day 1, then 1 tablet daily on days 2 through 5    Dispense:  6 tablet    Refill:  0    Supervising Provider:   DOMENICA BLACKBIRD A [[id number]]    Return if symptoms worsen or fail to improve.  Waddell KATHEE Mon, NP

## 2024-04-26 NOTE — Patient Instructions (Incomplete)
It was good to see you today, I will be in touch with your labs ?

## 2024-04-26 NOTE — Progress Notes (Deleted)
 [facility] [address], [address] [place], [place] [address] [phone]  Date:  04/29/2024   Name:  MIIA BLANKS   DOB:  1976/03/22   MRN:  [id number]  PCP:  Watt Harlene BROCKS, MD    Chief Complaint: No chief complaint on file.   History of Present Illness:  ALIAH ERIKSSON is a 48 y.o. very pleasant female patient who presents with the following:  Patient seen today for physical exam.  History of anxiety, doing okay without medication Married to [place], with 2 young adult children  I saw her most recently just over a year ago She did see my partner Waddell Mon just last week with acute illness, treated with azithromycin  and prednisone  Previous history of ASCUS Pap with negative HPV 2023.  We did a Pap for her last year-2024, negative Flu shot Colon cancer screening? She has declined COVID-19 vaccination Mammogram completed last month  Can update labs today Her TSH was suppressed last year, I asked her to come in for a follow-up TSH and T3 but this has not been done as of yet  Discussed the use of AI scribe software for clinical note transcription with the patient, who gave verbal consent to proceed.  History of Present Illness    Patient Active Problem List   Diagnosis Date Noted   Obesity, unspecified 01/18/2016   Bilateral thoracic back pain 11/02/2015   Macromastia 11/02/2015    No past medical history on file.  Past Surgical History:  Procedure Laterality Date   CHOLECYSTECTOMY     TUBAL LIGATION      Social History   Tobacco Use   Smoking status: Never   Smokeless tobacco: Never  Vaping Use   Vaping status: Never Used  Substance Use Topics   Alcohol use: Yes    Alcohol/week: 1.0 standard drink of alcohol    Types: 1 Standard drinks or equivalent per week    Family History  Problem Relation Age of Onset   Hyperlipidemia Mother    Hypertension Mother    Diabetes Father     Hyperlipidemia Father    Hypertension Father    Breast cancer Paternal Aunt    Ovarian cancer Paternal Aunt    Liver cancer Paternal Aunt    Breast cancer Maternal Grandmother    Stomach cancer Paternal Grandfather    Colon cancer Neg Hx     Allergies  Allergen Reactions   Codeine     Pt is not sure of reaction, occurred as a child     Medication list has been reviewed and updated.  Current Outpatient Medications on File Prior to Visit  Medication Sig Dispense Refill   azithromycin  (ZITHROMAX ) 250 MG tablet Take 2 tablets on day 1, then 1 tablet daily on days 2 through 5 6 tablet 0   predniSONE  (DELTASONE ) 20 MG tablet Take 1 tablet (20 mg total) by mouth daily with breakfast for 5 days. 5 tablet 0   No current facility-administered medications on file prior to visit.    Review of Systems:  As per HPI- otherwise negative.   Physical Examination: There were no vitals filed for this visit. There were no vitals filed for this visit. There is no height or weight on file to calculate BMI. Ideal Body Weight:    GEN: no acute distress. HEENT: Atraumatic, Normocephalic.  Ears and Nose: No external deformity. CV: RRR, No M/G/R. No JVD. No thrill. No extra  heart sounds. PULM: CTA B, no wheezes, crackles, rhonchi. No retractions. No resp. distress. No accessory muscle use. ABD: S, NT, ND, +BS. No rebound. No HSM. EXTR: No c/c/e PSYCH: Normally interactive. Conversant.    Assessment and Plan: No diagnosis found.  Assessment & Plan   Signed Harlene Schroeder, MD

## 2024-04-29 ENCOUNTER — Encounter: Admitting: Family Medicine

## 2024-04-29 DIAGNOSIS — Z1329 Encounter for screening for other suspected endocrine disorder: Secondary | ICD-10-CM

## 2024-04-29 DIAGNOSIS — R7989 Other specified abnormal findings of blood chemistry: Secondary | ICD-10-CM

## 2024-04-29 DIAGNOSIS — Z13220 Encounter for screening for lipoid disorders: Secondary | ICD-10-CM

## 2024-04-29 DIAGNOSIS — Z131 Encounter for screening for diabetes mellitus: Secondary | ICD-10-CM

## 2024-04-29 DIAGNOSIS — Z0000 Encounter for general adult medical examination without abnormal findings: Secondary | ICD-10-CM

## 2024-04-29 DIAGNOSIS — Z130 Encounter for screening for diseases of the blood and blood-forming organs and certain disorders involving the immune mechanism: Secondary | ICD-10-CM

## 2024-05-10 NOTE — Patient Instructions (Addendum)
 It was great to see you today, I will be in touch with your lab work a soon as possible Pap can be completed in 2 years- 3 years from last screening in 2024 We might consider a sleep study for you- if you do turn out to have sleep apnea your insurance may pay for tirzepatide for weight loss   Referral sent to GI for colon cancer screening Continue annual mammogram

## 2024-05-10 NOTE — Progress Notes (Addendum)
 [facility] [address], [address] [place], [place] [address] [phone]  Date:  05/13/2024   Name:  Kaitlyn Roach   DOB:  08/30/1975   MRN:  [id number]  PCP:  Watt Harlene BROCKS, MD    Chief Complaint: Annual Exam (Declines flu shot )   History of Present Illness:  Kaitlyn Roach is a 48 y.o. very pleasant female patient who presents with the following:  Patient seen today for physical exam.  History of anxiety, doing okay without medication Married to [place], with 2 young adult children-ages 22 and 24.  Her son just got engaged - I saw her most recently just over a year ago She did see my partner Waddell Mon earlier this month, treated with azithromycin  and prednisone  Previous history of ASCUS Pap with negative HPV 2023.  We did a Pap for her last year-2024, negative.  Per [facility] 5-year follow-up is recommended Flu shot- not today  Colon cancer screening- not done yet  She has declined COVID-19 vaccination Mammogram completed last month  Can update labs today Her TSH was suppressed last year, I asked her to come in for a follow-up TSH and T3 but this has not been done as of yet  Discussed the use of AI scribe software for clinical note transcription with the patient, who gave verbal consent to proceed.  History of Present Illness Kaitlyn Roach is a 48 year old female who presents for routine follow-up and blood work.  She has a history of a mildly atypical Pap smear in 2023, but her Pap smear last year was normal with negative HPV. She has not undergone colon cancer screening yet and has no known family history of colon cancer.  She prefers to have a colonoscopy for screening  She experiences irregular menstrual cycles, with her last period occurring in August for two days, and prior to that, she had not had a period for six months. Early menopause is common in her family.  She is not currently taking any  medications, including calcium or vitamin D  supplements. She previously used semaglutide for weight loss about a year and a half ago, which she tolerated well but did not see significant results.  In terms of her social history, she does not smoke or vape, drinks alcohol minimally, and has recently started walking two to three times a week for exercise. She lives in [address] and works in [place].  No chest pain or difficulty breathing during exercise.   Patient Active Problem List   Diagnosis Date Noted   Obesity, unspecified 01/18/2016   Bilateral thoracic back pain 11/02/2015   Macromastia 11/02/2015    No past medical history on file.  Past Surgical History:  Procedure Laterality Date   CHOLECYSTECTOMY     TUBAL LIGATION      Social History   Tobacco Use   Smoking status: Never   Smokeless tobacco: Never  Vaping Use   Vaping status: Never Used  Substance Use Topics   Alcohol use: Yes    Alcohol/week: 1.0 standard drink of alcohol    Types: 1 Standard drinks or equivalent per week    Family History  Problem Relation Age of Onset   Hyperlipidemia Mother    Hypertension Mother    Diabetes Father    Hyperlipidemia Father    Hypertension Father    Breast cancer Paternal Aunt    Ovarian cancer Paternal Aunt  Liver cancer Paternal Aunt    Breast cancer Maternal Grandmother    Stomach cancer Paternal Grandfather    Colon cancer Neg Hx     Allergies  Allergen Reactions   Codeine     Pt is not sure of reaction, occurred as a child     Medication list has been reviewed and updated.  No current outpatient medications on file prior to visit.   No current facility-administered medications on file prior to visit.    Review of Systems:  As per HPI- otherwise negative.   Physical Examination: Vitals:   05/13/24 1536  BP: 124/72  Pulse: 71  Temp: 98.5 F (36.9 C)  SpO2: 97%   Vitals:   05/13/24 1536  Weight: 202 lb 9.6 oz (91.9 kg)   Height: 5' 1 (1.549 m)   Body mass index is 38.28 kg/m. Ideal Body Weight: Weight in (lb) to have BMI = 25: 132  GEN: no acute distress.  Obese, looks well HEENT: Atraumatic, Normocephalic. Bilateral TM wnl, oropharynx normal.  PEERL,EOMI.   Ears and Nose: No external deformity. CV: RRR, No M/G/R. No JVD. No thrill. No extra heart sounds. PULM: CTA B, no wheezes, crackles, rhonchi. No retractions. No resp. distress. No accessory muscle use. ABD: S, NT, ND, +BS. No rebound. No HSM. EXTR: No c/c/e PSYCH: Normally interactive. Conversant.    Assessment and Plan: Physical exam  Screening for diabetes mellitus  Screening for thyroid  disorder  Screening for hyperlipidemia  Screening for colon cancer  Screening for deficiency anemia  Assessment & Plan Obesity Discussed obesity management. Previously tried semaglutide with moderate success. Considered GLP-1 agonists like tirzepatide, potentially covered by insurance if sleep apnea is diagnosed. - Consider sleep study for sleep apnea. - Discuss tirzepatide if sleep apnea is diagnosed. - [employer] for GLP-1 agonists.  Menstrual irregularity, possible early menopause Menstrual irregularity with possible early menopause. Family history noted. - Order [facility] test for menopausal status.  Adult Wellness Visit Routine visit. Blood pressure optimal. No medications. Discussed calcium, vitamin D , and exercise. - Order blood counts, metabolic profile, average blood sugar, cholesterol, thyroid  function tests. - Discuss calcium and vitamin D  supplementation. - Encourage exercise routine.  General Health Maintenance Discussed colon cancer screening. Options include Cologuard and colonoscopy. Mammogram completed. Pap smear normal, next due in three years. Declined flu shot. - Refer to gastroenterology for colonoscopy. - Follow up on Pap smear guidelines.  Signed Harlene Schroeder, MD  Addendum 9/30, received labs as  below.  Message to patient  Results for orders placed or performed in visit on 05/13/24  CBC   Collection Time: 05/13/24  3:59 PM  Result Value Ref Range   WBC 8.3 4.0 - 10.5 K/uL   RBC 4.83 3.87 - 5.11 Mil/uL   Platelets 205.0 150.0 - 400.0 K/uL   Hemoglobin 13.8 12.0 - 15.0 g/dL   HCT 59.0 63.9 - 53.9 %   MCV 84.8 78.0 - 100.0 fl   MCHC 33.7 30.0 - 36.0 g/dL   RDW 85.4 88.4 - 84.4 %  Comprehensive metabolic panel with GFR   Collection Time: 05/13/24  3:59 PM  Result Value Ref Range   Sodium 137 135 - 145 mEq/L   Potassium 3.9 3.5 - 5.1 mEq/L   Chloride 101 96 - 112 mEq/L   CO2 28 19 - 32 mEq/L   Glucose, Bld 85 70 - 99 mg/dL   BUN 8 6 - 23 mg/dL   Creatinine, Ser 9.33 0.40 - 1.20 mg/dL  Total Bilirubin 0.5 0.2 - 1.2 mg/dL   Alkaline Phosphatase 94 39 - 117 U/L   AST 19 0 - 37 U/L   ALT 30 0 - 35 U/L   Total Protein 7.2 6.0 - 8.3 g/dL   Albumin 4.6 3.5 - 5.2 g/dL   GFR 896.20 >39.99 mL/min   Calcium 9.4 8.4 - 10.5 mg/dL  Hemoglobin J8r   Collection Time: 05/13/24  3:59 PM  Result Value Ref Range   Hgb A1c MFr Bld 5.4 4.6 - 6.5 %  Lipid panel   Collection Time: 05/13/24  3:59 PM  Result Value Ref Range   Cholesterol 294 (H) 0 - 200 mg/dL   Triglycerides 699.9 (H) 0.0 - 149.0 mg/dL   HDL 51.89 >60.99 mg/dL   VLDL 39.9 (H) 0.0 - 59.9 mg/dL   LDL Cholesterol 814 (H) 0 - 99 mg/dL   Total CHOL/HDL Ratio 6    NonHDL 245.43   TSH   Collection Time: 05/13/24  3:59 PM  Result Value Ref Range   TSH 1.16 0.35 - 5.50 uIU/mL  T3, free   Collection Time: 05/13/24  3:59 PM  Result Value Ref Range   T3, Free 3.6 2.3 - 4.2 pg/mL  [facility]   Collection Time: 05/13/24  3:59 PM  Result Value Ref Range   FSH 11.7 mIU/ML    "

## 2024-05-13 ENCOUNTER — Ambulatory Visit (INDEPENDENT_AMBULATORY_CARE_PROVIDER_SITE_OTHER): Admitting: Family Medicine

## 2024-05-13 VITALS — BP 124/72 | HR 71 | Temp 98.5°F | Ht 61.0 in | Wt 202.6 lb

## 2024-05-13 DIAGNOSIS — Z13220 Encounter for screening for lipoid disorders: Secondary | ICD-10-CM

## 2024-05-13 DIAGNOSIS — Z131 Encounter for screening for diabetes mellitus: Secondary | ICD-10-CM | POA: Diagnosis not present

## 2024-05-13 DIAGNOSIS — N926 Irregular menstruation, unspecified: Secondary | ICD-10-CM

## 2024-05-13 DIAGNOSIS — Z0000 Encounter for general adult medical examination without abnormal findings: Secondary | ICD-10-CM

## 2024-05-13 DIAGNOSIS — Z130 Encounter for screening for diseases of the blood and blood-forming organs and certain disorders involving the immune mechanism: Secondary | ICD-10-CM

## 2024-05-13 DIAGNOSIS — Z1211 Encounter for screening for malignant neoplasm of colon: Secondary | ICD-10-CM

## 2024-05-13 DIAGNOSIS — Z1329 Encounter for screening for other suspected endocrine disorder: Secondary | ICD-10-CM

## 2024-05-14 ENCOUNTER — Encounter: Payer: Self-pay | Admitting: Family Medicine

## 2024-05-14 LAB — CBC
HCT: 40.9 % (ref 36.0–46.0)
Hemoglobin: 13.8 g/dL (ref 12.0–15.0)
MCHC: 33.7 g/dL (ref 30.0–36.0)
MCV: 84.8 fl (ref 78.0–100.0)
Platelets: 205 K/uL (ref 150.0–400.0)
RBC: 4.83 Mil/uL (ref 3.87–5.11)
RDW: 14.5 % (ref 11.5–15.5)
WBC: 8.3 K/uL (ref 4.0–10.5)

## 2024-05-14 LAB — COMPREHENSIVE METABOLIC PANEL WITH GFR
ALT: 30 U/L (ref 0–35)
AST: 19 U/L (ref 0–37)
Albumin: 4.6 g/dL (ref 3.5–5.2)
Alkaline Phosphatase: 94 U/L (ref 39–117)
BUN: 8 mg/dL (ref 6–23)
CO2: 28 meq/L (ref 19–32)
Calcium: 9.4 mg/dL (ref 8.4–10.5)
Chloride: 101 meq/L (ref 96–112)
Creatinine, Ser: 0.66 mg/dL (ref 0.40–1.20)
GFR: 103.79 mL/min (ref >60.00)
Glucose, Bld: 85 mg/dL (ref 70–99)
Potassium: 3.9 meq/L (ref 3.5–5.1)
Sodium: 137 meq/L (ref 135–145)
Total Bilirubin: 0.5 mg/dL (ref 0.2–1.2)
Total Protein: 7.2 g/dL (ref 6.0–8.3)

## 2024-05-14 LAB — LIPID PANEL
Cholesterol: 294 mg/dL — ABNORMAL HIGH (ref 0–200)
HDL: 48.1 mg/dL (ref >39.00)
LDL Cholesterol: 185 mg/dL — ABNORMAL HIGH (ref 0–99)
NonHDL: 245.43
Total CHOL/HDL Ratio: 6
Triglycerides: 300 mg/dL — ABNORMAL HIGH (ref 0.0–149.0)
VLDL: 60 mg/dL — ABNORMAL HIGH (ref 0.0–40.0)

## 2024-05-14 LAB — TSH: TSH: 1.16 u[IU]/mL (ref 0.35–5.50)

## 2024-05-14 LAB — FOLLICLE STIMULATING HORMONE: FSH: 11.7 m[IU]/mL

## 2024-05-14 LAB — T3, FREE: T3, Free: 3.6 pg/mL (ref 2.3–4.2)

## 2024-05-14 LAB — HEMOGLOBIN A1C: Hgb A1c MFr Bld: 5.4 % (ref 4.6–6.5)

## 2024-06-24 ENCOUNTER — Encounter: Payer: Self-pay | Admitting: Family Medicine

## 2024-06-24 DIAGNOSIS — R5383 Other fatigue: Secondary | ICD-10-CM

## 2024-07-02 ENCOUNTER — Ambulatory Visit: Admitting: Primary Care

## 2024-08-13 NOTE — Progress Notes (Unsigned)
" °  [facility] [address] [place], [place] [address] [phone]  Date:  08/14/2024   Name:  Kaitlyn Roach   DOB:  March 29, 1976   MRN:  [id number]  PCP:  Watt Harlene BROCKS, MD    Chief Complaint: No chief complaint on file.   History of Present Illness:  Kaitlyn Roach is a 47 y.o. very pleasant female patient who presents with the following:  Patient seen today with concern of acute illness.  Most recent visit with me was in September for her physical Generally in good health except for obesity. She is married with 2 young adult children She was ill in September, treated with azithromycin  and prednisone   Discussed the use of AI scribe software for clinical note transcription with the patient, who gave verbal consent to proceed.  History of Present Illness     Patient Active Problem List   Diagnosis Date Noted   Obesity, unspecified 01/18/2016   Bilateral thoracic back pain 11/02/2015   Macromastia 11/02/2015    No past medical history on file.  Past Surgical History:  Procedure Laterality Date   CHOLECYSTECTOMY     TUBAL LIGATION      Social History[1]  Family History  Problem Relation Age of Onset   Hyperlipidemia Mother    Hypertension Mother    Diabetes Father    Hyperlipidemia Father    Hypertension Father    Breast cancer Paternal Aunt    Ovarian cancer Paternal Aunt    Liver cancer Paternal Aunt    Breast cancer Maternal Grandmother    Stomach cancer Paternal Grandfather    Colon cancer Neg Hx     Allergies[2]  Medication list has been reviewed and updated.  Medications Ordered Prior to Encounter[3]  Review of Systems:  As per HPI- otherwise negative.   Physical Examination: There were no vitals filed for this visit. There were no vitals filed for this visit. There is no height or weight on file to calculate BMI. Ideal Body Weight:    GEN: no acute  distress. HEENT: Atraumatic, Normocephalic.  Ears and Nose: No external deformity. CV: RRR, No M/G/R. No JVD. No thrill. No extra heart sounds. PULM: CTA B, no wheezes, crackles, rhonchi. No retractions. No resp. distress. No accessory muscle use. ABD: S, NT, ND, +BS. No rebound. No HSM. EXTR: No c/c/e PSYCH: Normally interactive. Conversant.    Assessment and Plan: No diagnosis found.  Assessment & Plan   Signed Harlene Watt, MD    [1]  Social History Tobacco Use   Smoking status: Never   Smokeless tobacco: Never  Vaping Use   Vaping status: Never Used  Substance Use Topics   Alcohol use: Yes    Alcohol/week: 1.0 standard drink of alcohol    Types: 1 Standard drinks or equivalent per week  [2]  Allergies Allergen Reactions   Codeine     Pt is not sure of reaction, occurred as a child   [3]  No current outpatient medications on file prior to visit.   No current facility-administered medications on file prior to visit.   "

## 2024-08-14 ENCOUNTER — Other Ambulatory Visit (HOSPITAL_BASED_OUTPATIENT_CLINIC_OR_DEPARTMENT_OTHER): Payer: Self-pay

## 2024-08-14 ENCOUNTER — Ambulatory Visit: Admitting: Family Medicine

## 2024-08-14 ENCOUNTER — Encounter: Payer: Self-pay | Admitting: Family Medicine

## 2024-08-14 VITALS — BP 118/84 | HR 96 | Temp 98.1°F | Ht 61.0 in | Wt 196.6 lb

## 2024-08-14 DIAGNOSIS — J209 Acute bronchitis, unspecified: Secondary | ICD-10-CM

## 2024-08-14 MED ORDER — BENZONATATE 200 MG PO CAPS
200.0000 mg | ORAL_CAPSULE | Freq: Two times a day (BID) | ORAL | 1 refills | Status: AC | PRN
  Filled 2024-08-14: qty 60, 30d supply, fill #0

## 2024-08-14 MED ORDER — AZITHROMYCIN 250 MG PO TABS
ORAL_TABLET | ORAL | 0 refills | Status: AC
  Filled 2024-08-14: qty 6, 5d supply, fill #0

## 2024-08-20 ENCOUNTER — Encounter: Payer: Self-pay | Admitting: Primary Care

## 2024-08-20 ENCOUNTER — Ambulatory Visit: Admitting: Primary Care

## 2024-08-21 ENCOUNTER — Encounter: Payer: Self-pay | Admitting: Family Medicine

## 2024-08-21 ENCOUNTER — Ambulatory Visit: Payer: Self-pay

## 2024-08-21 DIAGNOSIS — J209 Acute bronchitis, unspecified: Secondary | ICD-10-CM

## 2024-08-21 NOTE — Telephone Encounter (Signed)
 Called her back- she notes she started getting worse again yesterday She notes pain between my shoulder blades which is there all the time, not just with cough She is able to function and do her normal activities- she is not in distress, no fever She states she is not sure if she is having shortness of breath, she does not think so   I ordered a chest x-ray for her and we will get her seen at noon tomorrow, I will need to add her to the schedule

## 2024-08-21 NOTE — Telephone Encounter (Signed)
 FYI Only or Action Required?: Action required by provider: clinical question for provider.  Patient was last seen in primary care on 08/14/2024 by Copland, Harlene BROCKS, MD.  Called Nurse Triage reporting Cough.  Symptoms began several weeks ago.  Interventions attempted: Prescription medications: Z-pak, tessalon  pearles.  Symptoms are: gradually improving.  Triage Disposition: Home Care  Patient/caregiver understands and will follow disposition?: No, wishes to speak with PCP Reason for Disposition  Cough  Answer Assessment - Initial Assessment Questions Given a z-pak and tessalon  pearles. Patient was advised to report if symptoms do not improve or get worse. Patient symptoms not improving and would like a CB from PCP. Please advise.  1. ONSET: When did the cough begin?      Dx with bronchitis 08/12/24  2. SEVERITY: How bad is the cough today?      Worse in the AM and PM  3. SPUTUM: Describe the color of your sputum (e.g., none, dry cough; clear, white, yellow, green)     Denies  4. HEMOPTYSIS: Are you coughing up any blood? If Yes, ask: How much? (e.g., flecks, streaks, tablespoons, etc.)     Denies  5. DIFFICULTY BREATHING: Are you having difficulty breathing? If Yes, ask: How bad is it? (e.g., mild, moderate, severe)      Denies  6. FEVER: Do you have a fever? If Yes, ask: What is your temperature, how was it measured, and when did it start?     Denies  7. CARDIAC HISTORY: Do you have any history of heart disease? (e.g., heart attack, congestive heart failure)      Denies  8. LUNG HISTORY: Do you have any history of lung disease?  (e.g., pulmonary embolus, asthma, emphysema)     Denies  9. OTHER SYMPTOMS: Do you have any other symptoms? (e.g., runny nose, wheezing, chest pain)       Pain in between shoulder blades and underneath boobs  Protocols used: Cough - Acute Productive-A-AH  Copied from CRM #[id number]. Topic: Clinical - Red Word  Triage >> Aug 21, 2024 12:25 PM Drema MATSU wrote: Red Word that prompted transfer to Nurse Triage: Pt states that her bronchitis is not getting better. She is coughing, congestion off and on, discomfort, pain between shoulder blades and chest   ----------------------------------------------------------------------- From previous Reason for Contact - Scheduling: Patient/patient representative is calling to schedule an appointment. Refer to attachments for appointment information.

## 2024-08-22 ENCOUNTER — Ambulatory Visit (HOSPITAL_BASED_OUTPATIENT_CLINIC_OR_DEPARTMENT_OTHER)
Admission: RE | Admit: 2024-08-22 | Discharge: 2024-08-22 | Disposition: A | Discharge status: Home / Self Care | Source: Ambulatory Visit | Attending: Family Medicine | Admitting: Family Medicine

## 2024-08-22 ENCOUNTER — Ambulatory Visit: Admitting: Family Medicine

## 2024-08-22 ENCOUNTER — Encounter: Payer: Self-pay | Admitting: Family Medicine

## 2024-08-22 ENCOUNTER — Other Ambulatory Visit (HOSPITAL_BASED_OUTPATIENT_CLINIC_OR_DEPARTMENT_OTHER): Payer: Self-pay

## 2024-08-22 VITALS — BP 134/81 | HR 86 | Temp 98.1°F | Ht 61.0 in | Wt 196.4 lb

## 2024-08-22 DIAGNOSIS — J209 Acute bronchitis, unspecified: Secondary | ICD-10-CM | POA: Insufficient documentation

## 2024-08-22 DIAGNOSIS — R052 Subacute cough: Secondary | ICD-10-CM

## 2024-08-22 MED ORDER — PREDNISONE 20 MG PO TABS
ORAL_TABLET | ORAL | 0 refills | Status: AC
  Filled 2024-08-22: qty 9, 6d supply, fill #0

## 2024-08-22 MED ORDER — PROMETHAZINE-DM 6.25-15 MG/5ML PO SYRP
5.0000 mL | ORAL_SOLUTION | Freq: Four times a day (QID) | ORAL | 0 refills | Status: AC | PRN
  Filled 2024-08-22: qty 118, 6d supply, fill #0

## 2024-08-22 NOTE — Progress Notes (Signed)
 [facility] [address] [place], [place] [address] [phone]  Date:  08/22/2024   Name:  Kaitlyn Roach   DOB:  [birth year]   MRN:  [id number]  PCP:  Watt Harlene BROCKS, MD    Chief Complaint: Follow-up   History of Present Illness:  Kaitlyn Roach is a 49 y.o. very pleasant female patient who presents with the following:  Pt seen today with concern of illness  I saw her for bronchitis type sx on 12/31: Symptoms suggest bronchitis with no pneumonia signs. - Prescribed azithromycin  (Z-Pak) for 5 days. - Prescribed Tessalon  for cough relief. - Advised to report if symptoms do not improve or worsen.   She contacted us  yesterday as she was feeling worse, we make appt for today   Discussed the use of AI scribe software for clinical note transcription with the patient, who gave verbal consent to proceed.  History of Present Illness Kaitlyn Roach is a 49 year old female who presents with persistent cough and back pain.  She has been experiencing a persistent cough that intermittently worsens and is sometimes accompanied by coughing spells. She completed a course of Z-Pak and Tessalon  Perles, which were started on New Year's Eve, but the cough persists. No fever or chest pain is present.  She describes a dull pain located between her shoulder blades, which is slightly improved today. The pain does not radiate and is not associated with any swelling or pressure. No difficulty breathing or bowel issues are reported.  She has a known allergy to codeine and has not used hydrocodone or other narcotic pain medications recently. She recalls having surgery for gallbladder removal over twenty years ago, but has not required pain medication since then.  DG Chest 2 View Result Date: 08/22/2024 CLINICAL DATA:  Recently diagnosed with bronchitis or with worsening shortness of breath and experiencing pain in  between her shoulder blades. EXAM: CHEST - 2 VIEW COMPARISON:  None Available. FINDINGS: The heart size and mediastinal contours are within normal limits. Both lungs are clear. Radiopaque surgical clips are seen within the right upper quadrant. The visualized skeletal structures are unremarkable. IMPRESSION: No active cardiopulmonary disease. Electronically Signed   By: Suzen Dials M.D.   On: 08/22/2024 11:43     Patient Active Problem List   Diagnosis Date Noted   Obesity, unspecified 01/18/2016   Bilateral thoracic back pain 11/02/2015   Macromastia 11/02/2015    No past medical history on file.  Past Surgical History:  Procedure Laterality Date   CHOLECYSTECTOMY     TUBAL LIGATION      Social History[1]  Family History  Problem Relation Age of Onset   Hyperlipidemia Mother    Hypertension Mother    Diabetes Father    Hyperlipidemia Father    Hypertension Father    Breast cancer Paternal Aunt    Ovarian cancer Paternal Aunt    Liver cancer Paternal Aunt    Breast cancer Maternal Grandmother    Stomach cancer Paternal Grandfather    Colon cancer Neg Hx     Allergies[2]  Medication list has been reviewed and updated.  Medications Ordered Prior to Encounter[3]  Review of Systems:  As per HPI- otherwise negative.   Physical Examination: Vitals:   08/22/24 1201  BP: 134/81  Pulse: 86  Temp: 98.1 F (36.7 C)  SpO2: 98%   Vitals:   08/22/24 1201  Weight: 196  lb 6.4 oz (89.1 kg)  Height: 5' 1 (1.549 m)   Body mass index is 37.11 kg/m. Ideal Body Weight: Weight in (lb) to have BMI = 25: 132  GEN: no acute distress.  Obese, looks well  HEENT: Atraumatic, Normocephalic.  Ears and Nose: No external deformity. CV: RRR, No M/G/R. No JVD. No thrill. No extra heart sounds. PULM: CTA B, no wheezes, crackles, rhonchi. No retractions. No resp. distress. No accessory muscle use. EXTR: No c/c/e PSYCH: Normally interactive. Conversant.    Assessment and  Plan: Subacute cough - Plan: promethazine -dextromethorphan (PROMETHAZINE -DM) 6.25-15 MG/5ML syrup, predniSONE  (DELTASONE ) 20 MG tablet  Assessment & Plan Subacute cough Intermittent cough likely residual from previous infection. Normal chest x-ray. No serious conditions suspected at this time.  I did offer to do further eval such as a CT to rule out PE; pt feels that she is doing ok and declines for now. Allergy to codeine noted. - Prescribed prednisone  with a six-day taper: two pills a day for three days, then one pill a day for three days. May stop if symptoms improve after three days. - Prescribed promethazine  syrup, one teaspoon four times a day as needed for cough and sleep aid. - Advised to report if no improvement in the next few days.  Signed Harlene Schroeder, MD     [1]  Social History Tobacco Use   Smoking status: Never   Smokeless tobacco: Never  Vaping Use   Vaping status: Never Used  Substance Use Topics   Alcohol use: Yes    Alcohol/week: 1.0 standard drink of alcohol    Types: 1 Standard drinks or equivalent per week  [2]  Allergies Allergen Reactions   Codeine     Pt is not sure of reaction, occurred as a child   [3]  Current Outpatient Medications on File Prior to Visit  Medication Sig Dispense Refill   benzonatate  (TESSALON ) 200 MG capsule Take 1 capsule (200 mg total) by mouth 2 (two) times daily as needed for cough. 60 capsule 1   No current facility-administered medications on file prior to visit.   "
